# Patient Record
Sex: Female | Born: 1955 | Race: White | Hispanic: No | Marital: Married | Smoking: Former smoker
Health system: Southern US, Community
[De-identification: ages and names within clinical notes are randomized; demographics above are authoritative.]

## PROBLEM LIST (undated history)

## (undated) DIAGNOSIS — F32A Depression, unspecified: Secondary | ICD-10-CM

## (undated) DIAGNOSIS — R519 Headache, unspecified: Secondary | ICD-10-CM

## (undated) DIAGNOSIS — I10 Essential (primary) hypertension: Secondary | ICD-10-CM

## (undated) DIAGNOSIS — I2510 Atherosclerotic heart disease of native coronary artery without angina pectoris: Secondary | ICD-10-CM

## (undated) DIAGNOSIS — D3A090 Benign carcinoid tumor of the bronchus and lung: Secondary | ICD-10-CM

## (undated) DIAGNOSIS — I739 Peripheral vascular disease, unspecified: Secondary | ICD-10-CM

## (undated) DIAGNOSIS — E039 Hypothyroidism, unspecified: Secondary | ICD-10-CM

## (undated) DIAGNOSIS — R42 Dizziness and giddiness: Secondary | ICD-10-CM

## (undated) DIAGNOSIS — T8859XA Other complications of anesthesia, initial encounter: Secondary | ICD-10-CM

## (undated) DIAGNOSIS — F419 Anxiety disorder, unspecified: Secondary | ICD-10-CM

## (undated) DIAGNOSIS — T4145XA Adverse effect of unspecified anesthetic, initial encounter: Secondary | ICD-10-CM

## (undated) DIAGNOSIS — I714 Abdominal aortic aneurysm, without rupture, unspecified: Secondary | ICD-10-CM

## (undated) DIAGNOSIS — K219 Gastro-esophageal reflux disease without esophagitis: Secondary | ICD-10-CM

## (undated) HISTORY — DX: Gastro-esophageal reflux disease without esophagitis: K21.9

## (undated) HISTORY — PX: ABDOMINAL HYSTERECTOMY: SHX81

## (undated) HISTORY — DX: Dizziness and giddiness: R42

## (undated) HISTORY — PX: VESICOVAGINAL FISTULA CLOSURE W/ TAH: SUR271

## (undated) HISTORY — PX: BARTHOLIN CYST MARSUPIALIZATION: SHX5383

## (undated) HISTORY — DX: Hypothyroidism, unspecified: E03.9

## (undated) HISTORY — DX: Anxiety disorder, unspecified: F41.9

---

## 2001-03-13 ENCOUNTER — Ambulatory Visit (HOSPITAL_COMMUNITY): Admission: RE | Admit: 2001-03-13 | Discharge: 2001-03-13 | Payer: Self-pay | Admitting: Family Medicine

## 2001-03-13 ENCOUNTER — Encounter: Payer: Self-pay | Admitting: Family Medicine

## 2001-03-22 ENCOUNTER — Ambulatory Visit (HOSPITAL_COMMUNITY): Admission: RE | Admit: 2001-03-22 | Discharge: 2001-03-22 | Payer: Self-pay | Admitting: Family Medicine

## 2001-03-22 ENCOUNTER — Encounter: Payer: Self-pay | Admitting: Family Medicine

## 2001-04-03 ENCOUNTER — Ambulatory Visit (HOSPITAL_COMMUNITY): Admission: RE | Admit: 2001-04-03 | Discharge: 2001-04-03 | Payer: Self-pay | Admitting: Neurosurgery

## 2001-04-10 ENCOUNTER — Encounter (HOSPITAL_COMMUNITY): Admission: RE | Admit: 2001-04-10 | Discharge: 2001-05-10 | Payer: Self-pay | Admitting: Family Medicine

## 2001-05-13 ENCOUNTER — Encounter (HOSPITAL_COMMUNITY): Admission: RE | Admit: 2001-05-13 | Discharge: 2001-06-12 | Payer: Self-pay | Admitting: Family Medicine

## 2001-06-14 ENCOUNTER — Encounter (HOSPITAL_COMMUNITY): Admission: RE | Admit: 2001-06-14 | Discharge: 2001-07-14 | Payer: Self-pay | Admitting: Family Medicine

## 2002-02-11 ENCOUNTER — Encounter: Payer: Self-pay | Admitting: Neurosurgery

## 2002-02-11 ENCOUNTER — Ambulatory Visit (HOSPITAL_COMMUNITY): Admission: RE | Admit: 2002-02-11 | Discharge: 2002-02-11 | Payer: Self-pay | Admitting: Neurosurgery

## 2002-12-29 ENCOUNTER — Encounter: Payer: Self-pay | Admitting: *Deleted

## 2002-12-29 ENCOUNTER — Emergency Department (HOSPITAL_COMMUNITY): Admission: EM | Admit: 2002-12-29 | Discharge: 2002-12-29 | Payer: Self-pay | Admitting: *Deleted

## 2005-07-17 ENCOUNTER — Ambulatory Visit (HOSPITAL_COMMUNITY): Admission: RE | Admit: 2005-07-17 | Discharge: 2005-07-17 | Payer: Self-pay | Admitting: Specialist

## 2006-07-12 ENCOUNTER — Inpatient Hospital Stay (HOSPITAL_COMMUNITY): Admission: RE | Admit: 2006-07-12 | Discharge: 2006-07-14 | Payer: Self-pay | Admitting: Obstetrics and Gynecology

## 2006-07-12 ENCOUNTER — Encounter (INDEPENDENT_AMBULATORY_CARE_PROVIDER_SITE_OTHER): Payer: Self-pay | Admitting: Specialist

## 2008-04-06 ENCOUNTER — Ambulatory Visit (HOSPITAL_COMMUNITY): Admission: RE | Admit: 2008-04-06 | Discharge: 2008-04-06 | Payer: Self-pay | Admitting: Family Medicine

## 2010-08-31 ENCOUNTER — Ambulatory Visit (HOSPITAL_COMMUNITY): Admission: RE | Admit: 2010-08-31 | Discharge: 2010-08-31 | Payer: Self-pay | Admitting: Family Medicine

## 2010-09-07 ENCOUNTER — Encounter (HOSPITAL_COMMUNITY)
Admission: RE | Admit: 2010-09-07 | Discharge: 2010-10-07 | Payer: Self-pay | Source: Home / Self Care | Attending: Family Medicine | Admitting: Family Medicine

## 2010-09-14 ENCOUNTER — Encounter (INDEPENDENT_AMBULATORY_CARE_PROVIDER_SITE_OTHER): Payer: Self-pay | Admitting: *Deleted

## 2010-09-14 ENCOUNTER — Ambulatory Visit: Payer: Self-pay | Admitting: Gastroenterology

## 2010-10-04 ENCOUNTER — Ambulatory Visit
Admission: RE | Admit: 2010-10-04 | Discharge: 2010-10-04 | Payer: Self-pay | Source: Home / Self Care | Attending: Gastroenterology | Admitting: Gastroenterology

## 2010-10-04 DIAGNOSIS — R109 Unspecified abdominal pain: Secondary | ICD-10-CM | POA: Insufficient documentation

## 2010-10-05 ENCOUNTER — Encounter: Payer: Self-pay | Admitting: Gastroenterology

## 2010-10-07 LAB — CONVERTED CEMR LAB
Albumin: 4.6 g/dL (ref 3.5–5.2)
Total Bilirubin: 0.5 mg/dL (ref 0.3–1.2)

## 2010-10-23 ENCOUNTER — Encounter: Payer: Self-pay | Admitting: Family Medicine

## 2010-11-03 NOTE — Letter (Signed)
Summary: Unable to Reach, Consult Scheduled  [facility]  [address]   [place], [place] [address]   Phone: [phone]  Fax: [phone]    09/14/2010  Linda Mayer [address] [place], [place]  [address] September 18, 1956   Dear [name],   At the recommendation of DR [facility]  we have been asked to schedule you a consult with DR FIELDS for ABDOMINAL PAIN. Please call our office at [phone].     Thank you,    Diana Eves  [facility] R. Roetta Sessions, M.D.    Jonette Eva, M.D. Lorenza Burton, FNP-BC    Tana Coast, PA-C Phone: [phone]    Fax: [phone]

## 2010-11-03 NOTE — Assessment & Plan Note (Addendum)
Summary: abd pain/ss   Visit Type:  Initial Consult Referring Provider:  Dr. Regino Schultze Primary Care Provider:  Dr. Regino Schultze  CC:  upper R abd pain that moves to pts back.  History of Present Illness: [name] presents today as a consult at the request of Dr. Regino Schultze secondary to RUQ pain. She reports RUQ pain, at times radiating around to back X 4-5 mos. Intermittent in nature, at most 10/10, now reported as mainly "nagging". +belching relieves discomfort. Described as a "soreness". Only one episode of nausea, otherwise no nausea. No dysphagia or odynophagia. +worsening indigestion over past few months. Hx of reflux for several years. No loss of appetite, +weight gain, no wt loss. Gas-x, no tums. BM every morning. no brbpr, no melena. Reportedly takes aleve 3-4X/week. Last colonoscopy in 2003 by Dr. Lovell Sheehan: moderate internal/external hemorrhoids, few small diverticula. Repeat in 10 years.   [place] of abdomen Nov 2011: no stones, probable mild fatty liver HIDA Dec 2011: nl EF at 85%, no reproduction of symptoms No labs  Current Medications (verified): 1)  Alprazolam 0.5 Mg Tabs (Alprazolam) .... 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 1ppd X 30+years Occasional ETOH, socially Illicit Drug Use - no Smoking Status:  current Drug Use:  no  Review of Systems General:  Denies fever, chills, and anorexia. Eyes:  Denies blurring, irritation, and discharge. ENT:  Denies sore throat, hoarseness, and difficulty swallowing. CV:  Denies chest pains and syncope. Resp:  Denies dyspnea at rest and wheezing. GI:  Complains of  indigestion/heartburn and abdominal pain; denies difficulty swallowing, pain on swallowing, nausea, constipation, change in bowel habits, bloody BM's, and black BMs. GU:  Denies urinary burning, urinary frequency, and urinary incontinence. MS:  Denies joint pain / LOM, joint swelling, and joint stiffness. Derm:  Denies rash, itching, and dry skin. Neuro:  Denies weakness and syncope. Psych:  Denies depression and anxiety. Endo:  Denies cold intolerance and heat intolerance. Heme:  Denies bruising and bleeding.  Vital Signs:  Patient profile:   55 year old female Height:      65 inches Weight:      167 pounds BMI:     27.89 Temp:     97.8 degrees F oral Pulse rate:   76 / minute BP sitting:   130 / 88  (left arm) Cuff size:   regular  Vitals Entered By: Hendricks Limes LPN (October 04, 2010 11:04 AM)  Physical Exam  General:  Well developed, well nourished, no acute distress. Head:  Normocephalic and atraumatic. Eyes:  sclera without icterus Mouth:  No deformity or lesions, dentition normal. Lungs:  Clear throughout to auscultation. Heart:  Regular rate and rhythm; no murmurs, rubs,  or bruits. Abdomen:  normal bowel sounds, without guarding, without rebound, no tenderness, no masses, and no hepatomegally or splenomegaly.   Msk:  Symmetrical with no gross deformities. Normal posture. Pulses:  Normal pulses noted. Extremities:  No clubbing, cyanosis, edema or deformities noted. Neurologic:  Alert and  oriented x4;  grossly normal neurologically. Skin:  Intact without significant lesions or rashes. Psych:  Alert and cooperative. Normal mood and affect.  Impression & Recommendations:  Problem # 1:  ABDOMINAL  PAIN (ICD-57.55)  55 year old female with 4-5 month hx of RUQ pain, at times radiating around to back, intermittent, now just nagging and relieved by belching. hx of reflux, not on a PPI currently. No dysphagia or odynophagia. Does have reported weight gain, +smoking. +Aleve  use 3-4X week. [place] with no stones, probable fatty liver, HIDA nl. Likely gastritis vs PUD, secondary to NSAID use. Doubt biliary component.   Avoid NSAIDs Smoking cessation Being Prilosec 20 mg daily, called into pharmacy, progress report in 2 weeks. May need EGD if symptoms continue Baseline LFTs secondary to US findings of fatty liver, no recent labs. Return in 3 mos.  Orders: T-Hepatic Function [phone]) Consultation Level III [phone])  Appended Document: abd pain/ss    Prescriptions: PRILOSEC 20 MG CPDR (OMEPRAZOLE) take 1 by mouth 30 minutes before meals  #30 x 3   Entered and Authorized by:   Gerrit Halls NP   Signed by:   Gerrit Halls NP on 10/04/2010   Method used:   Faxed to ...       [facility]  [address] [address] [id number] (retail)       [address]       [place], [place]  [address]       Ph: [phone] or [phone]       Fax: [phone]   RxID:   [id number]     Appended Document: abd pain/ss 3 MONTH F/U OPV IS IN THE COMPUTER  Appended Document: abd pain/ss we need a PR on pt. She was prescribed a PPI at OV. If she is continuing to have reflux, and/or pain, she needs to be set up for an upper endoscopy.   Appended Document: abd pain/ss Pt said she is taking the Prilosec and it is helping, will call if she has problems.  Appended Document: abd pain/ss noted. thanks

## 2011-02-17 NOTE — Op Note (Signed)
NAME:  Linda Mayer, Linda Mayer NO.:  [id number]   MEDICAL RECORD NO.:  [id number]          PATIENT TYPE:  INP   LOCATION:  A417                          FACILITY:  [facility]   PHYSICIAN:  Tilda Burrow, M.D. DATE OF BIRTH:  01-18-1956   DATE OF PROCEDURE:  07/12/2006  DATE OF DISCHARGE:  07/14/2006                                 OPERATIVE REPORT   PREOPERATIVE DIAGNOSES:  1. Symptomatic uterine fibroids.  2. Pelvic pain.  3. Irregular periods.   POSTOPERATIVE DIAGNOSIS:  1. Symptomatic uterine fibroids.  2. Pelvic pain.  3. Irregular periods.   PROCEDURE:  Total abdominal hysterectomy.   SURGEON:  Tilda Burrow, M.D.   ASSESSMENT:  Annabell Howells, RN, Amie Critchley, CST.   ANESTHESIA:  General.   COMPLICATIONS:  None.   FINDINGS:  A large, bulky, irregular fibroid uterus, with lots of pressure  on the prominent sacrum suspected due to the fibroid's position at the  posterior fundal portion of the uterus, anteriorly-oriented vaginal axis.   DETAILS OF PROCEDURE:  The patient was taken to the operating room and  prepped and draped for lower abdominal surgery.  A Pfannenstiel incision  performed with the excision of a small layer of skin to improve pelvic  access.  The Pfannenstiel incision was performed through the fascia,  splitting the rectus muscles in the midline and opening the peritoneal  cavity bluntly without difficulty or suspicion of trauma to internal organs.  The bowel was well-emptied by the prep performed the night before, so  packing the bowel away was easily performed with two moistened laps, Balfour  retractor positioned and attention directed to the pelvis.  The round  ligaments were identified, grasped with Kelly clamps and transected after  being suture ligated.  The utero-ovarian ligaments were isolated, clamped,  cut, and suture ligated with 0 chromic.  The bladder flap was mobilized  anteriorly.  The upper and lower cardinal ligaments were serially  clamped,  cut, and suture ligated using straight Heaney clamps, knife dissection, and  0 chromic suture ligature.  We marched down to the level of the large bulky  cervix, making note that the vaginal support was very anterior in an  otherwise deep pelvis.  We therefore made a small figure-of-eight suture in  the posterior aspects of the cuff once we had reached that part of the case,  to be described later.   After marching down to the level of the cuff, we amputated the cervix off of  the lower uterine segment to improve access to the pelvis and then made a  stab incision in the anterior cervicovaginal fornix, amputated the large  bulbous cervix from the vaginal cuff, which was grasped with Kocher clamps  to maintain orientation.  A posterior figure-of-eight suture was placed to  develop a small posterior pouch to effectively lengthen the functional  vaginal length for the future.  The cuff was then closed with an Aldridge  stitch at each side of the lateral vaginal angle, followed by a continuous  running 0 chromic suture closure of the vaginal cuff.  Hemostasis was good.  The pelvis was irrigated and  hemostasis confirmed.  The peritoneum was  pulled over the cuff using interrupted 2-0 chromic sutures.  The pedicles  were inspected again and confirmed as hemostatic.  Laparotomy equipment  removed, the abdomen irrigated again, anterior peritoneum closed with 2-0  chromic, the rectus muscles reapproximated with 0 Vicryl with some oozing  from the left rectus muscles necessitating one additional stitch in the  rectus muscle to improve hemostasis.  We then closed the fascia in two  separate layers in the lateral one-third of the incision and the middle  portion closed, with both layers closed with the same suture with continuous  running 0 Vicryl suture closure of the fascia.  The subcutaneous tissues  were mobilized and left to be pulled together at 1 cm beneath the skin,  resulting in  good obliteration of the potential space with staple closure of  the skin completing the procedure.  Sponge and needle counts were correct.  The patient tolerated the procedure well and went to the recovery room in  good condition.      Tilda Burrow, M.D.  Electronically Signed     JVF/MEDQ  D:  07/12/2006  T:  07/13/2006  Job:  [id number]   cc:   Kirk Ruths, M.D.  Fax: [phone]

## 2011-02-17 NOTE — Discharge Summary (Signed)
NAME:  MICHAELANN, GUNNOE NO.:  [id number]   MEDICAL RECORD NO.:  [id number]          PATIENT TYPE:  INP   LOCATION:  A417                          FACILITY:  [facility]   PHYSICIAN:  Tilda Burrow, M.D. DATE OF BIRTH:  1955/11/30   DATE OF ADMISSION:  07/12/2006  DATE OF DISCHARGE:  10/13/2007LH                                 DISCHARGE SUMMARY   ADMITTING DIAGNOSES:  1. Uterine fibroids.  2. History of endometrial hyperplasia.  3. Pelvic pain.   DISCHARGE DIAGNOSES:  1. Uterine fibroids, benign.  2. History of endometrial hyperplasia.  3. Pelvic pain.   PROCEDURES:  Total abdominal hysterectomy.   DISCHARGE MEDICATIONS:  1. Dulcolax suppository to be used at home x1.  2. Tylox 1 q.4 h. p.r.n. pain, dispense #20.   FOLLOW UP:  Followup in 6 days [facility].   HOSPITAL SUMMARY:  The patient was admitted for hysterectomy as described in  the HPI.  She went through day surgery, having received bowel prep the day  before surgery.  She had a hemoglobin preop of 13, hematocrit 37.9.  Hysterectomy removed a 219 g uterus with multiple irregular fibroids.  There  was some skin excised as well in order to optimize the incision appearance.  The patient had a postoperative hemoglobin of 12.4, hematocrit 35.8, white  count 9900.  She remained afebrile.  Tolerated a regular diet on the first  postoperative day.  Had some gas complaints, gassy discomfort, the second  postop day but nonetheless was comfortable with going home and giving  herself a Dulcolax suppository at home.   She will followup in 6 days for staple removal in our office.  Signs and  symptoms of fever, infection, or wound complications reviewed.  May shower  immediately and tub bath, and drive after 2 weeks.      Tilda Burrow, M.D.  Electronically Signed     JVF/MEDQ  D:  07/14/2006  T:  07/16/2006  Job:  [id number]   cc:   [facility]
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
## 2011-10-16 IMAGING — NM NM HEPATO W/GB/PHARM/[PERSON_NAME]
2 series · 12 of 12 positions shown · non-contrast
Comparison: None

CLINICAL DATA: Right upper quadrant pain

NUCLEAR MEDICINE HEPATOBILIARY IMAGING WITH GALLBLADDER EF:
TECHNIQUE: Sequential images of the abdomen were obtained for 60
minutes following intravenous administration of
radiopharmaceutical.  Patient then received an infusion of
ugm/kg of CCK analog intravenously over 30 minutes, and imaging was
continued for 30 minutes.  A time-activity curve was generated from
tracer within the gallbladder following CCK administration, and the
gallbladder ejection fraction was calculated.
Radiopharmaceutical:  5.2 mCi 3c-44m mebrofenin
Pharmaceutical:  1.45 mcg CCK

[hida · 3.20mm/px · 6 of 60 frames shown (1 of 2)]
[frame 6/60]
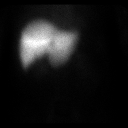
[frame 16/60]
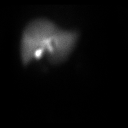
[frame 26/60]
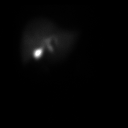
[frame 36/60]
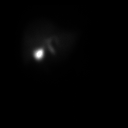
[frame 46/60]
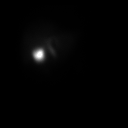
[frame 56/60]
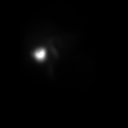

[hida · 3.20mm/px · 6 of 30 frames shown (2 of 2)]
[frame 3/30]
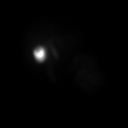
[frame 8/30]
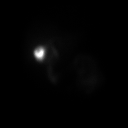
[frame 13/30]
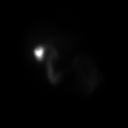
[frame 18/30]
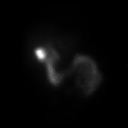
[frame 23/30]
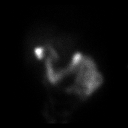
[frame 28/30]
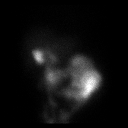

[12 of 12 positions shown; findings below may reference images not displayed]

FINDINGS: Prompt tracer extraction from bloodstream, indicating normal
hepatocellular function.
Prompt excretion of tracer into biliary tree.
Gallbladder visualized at 14 minutes.
Small bowel visualized at 54 minutes.
No hepatic retention of tracer.

Subjectively normal emptying of tracer from gallbladder following
CCK stimulation.
Calculated gallbladder ejection fraction is 85%, normal.
Patient  did not experience symptoms following CCK administration.
IMPRESSION: Normal exam.

Normal values for gallbladder ejection fraction:
> 30% for exams utilizing sincalide (CCK)
> 50% for exams utilizing fatty meal stimulation

## 2011-11-13 ENCOUNTER — Other Ambulatory Visit (HOSPITAL_COMMUNITY): Payer: Self-pay | Admitting: Family Medicine

## 2011-11-13 DIAGNOSIS — Z139 Encounter for screening, unspecified: Secondary | ICD-10-CM

## 2011-11-14 ENCOUNTER — Ambulatory Visit (HOSPITAL_COMMUNITY)
Admission: RE | Admit: 2011-11-14 | Discharge: 2011-11-14 | Disposition: A | Discharge status: Home / Self Care | Payer: 59 | Source: Ambulatory Visit | Attending: Family Medicine | Admitting: Family Medicine

## 2011-11-14 DIAGNOSIS — Z1231 Encounter for screening mammogram for malignant neoplasm of breast: Secondary | ICD-10-CM | POA: Insufficient documentation

## 2011-11-14 DIAGNOSIS — Z139 Encounter for screening, unspecified: Secondary | ICD-10-CM

## 2013-12-12 ENCOUNTER — Other Ambulatory Visit: Payer: Self-pay | Admitting: Obstetrics and Gynecology

## 2013-12-12 DIAGNOSIS — Z139 Encounter for screening, unspecified: Secondary | ICD-10-CM

## 2014-01-05 ENCOUNTER — Ambulatory Visit (HOSPITAL_COMMUNITY)
Admission: RE | Admit: 2014-01-05 | Discharge: 2014-01-05 | Disposition: A | Discharge status: Home / Self Care | Payer: 59 | Source: Ambulatory Visit | Attending: Obstetrics and Gynecology | Admitting: Obstetrics and Gynecology

## 2014-01-05 DIAGNOSIS — Z1231 Encounter for screening mammogram for malignant neoplasm of breast: Secondary | ICD-10-CM | POA: Insufficient documentation

## 2014-01-05 DIAGNOSIS — Z139 Encounter for screening, unspecified: Secondary | ICD-10-CM

## 2014-01-07 ENCOUNTER — Other Ambulatory Visit: Payer: Self-pay | Admitting: Obstetrics and Gynecology

## 2014-01-07 DIAGNOSIS — R928 Other abnormal and inconclusive findings on diagnostic imaging of breast: Secondary | ICD-10-CM

## 2014-01-28 ENCOUNTER — Encounter (HOSPITAL_COMMUNITY): Payer: 59

## 2014-02-18 ENCOUNTER — Ambulatory Visit (HOSPITAL_COMMUNITY)
Admission: RE | Admit: 2014-02-18 | Discharge: 2014-02-18 | Disposition: A | Discharge status: Home / Self Care | Payer: 59 | Source: Ambulatory Visit | Attending: Obstetrics and Gynecology | Admitting: Obstetrics and Gynecology

## 2014-02-18 ENCOUNTER — Other Ambulatory Visit: Payer: Self-pay | Admitting: Obstetrics and Gynecology

## 2014-02-18 DIAGNOSIS — R928 Other abnormal and inconclusive findings on diagnostic imaging of breast: Secondary | ICD-10-CM

## 2014-10-28 ENCOUNTER — Other Ambulatory Visit (HOSPITAL_COMMUNITY): Payer: Self-pay | Admitting: Family Medicine

## 2014-10-28 DIAGNOSIS — E0590 Thyrotoxicosis, unspecified without thyrotoxic crisis or storm: Secondary | ICD-10-CM

## 2014-10-29 ENCOUNTER — Ambulatory Visit (HOSPITAL_COMMUNITY)
Admission: RE | Admit: 2014-10-29 | Discharge: 2014-10-29 | Disposition: A | Discharge status: Home / Self Care | Payer: 59 | Source: Ambulatory Visit | Attending: Family Medicine | Admitting: Family Medicine

## 2014-10-29 DIAGNOSIS — E0590 Thyrotoxicosis, unspecified without thyrotoxic crisis or storm: Secondary | ICD-10-CM | POA: Diagnosis not present

## 2014-11-16 ENCOUNTER — Other Ambulatory Visit (HOSPITAL_COMMUNITY): Payer: Self-pay | Admitting: "Endocrinology

## 2014-11-16 DIAGNOSIS — E049 Nontoxic goiter, unspecified: Secondary | ICD-10-CM

## 2015-05-17 ENCOUNTER — Ambulatory Visit (HOSPITAL_COMMUNITY): Admission: RE | Admit: 2015-05-17 | Payer: 59 | Source: Ambulatory Visit

## 2015-11-08 ENCOUNTER — Ambulatory Visit: Payer: Self-pay | Admitting: "Endocrinology

## 2015-12-29 ENCOUNTER — Ambulatory Visit (INDEPENDENT_AMBULATORY_CARE_PROVIDER_SITE_OTHER): Payer: BLUE CROSS/BLUE SHIELD | Admitting: "Endocrinology

## 2015-12-29 ENCOUNTER — Encounter: Payer: Self-pay | Admitting: "Endocrinology

## 2015-12-29 VITALS — BP 126/74 | HR 80 | Ht 64.0 in | Wt 160.0 lb

## 2015-12-29 DIAGNOSIS — E0590 Thyrotoxicosis, unspecified without thyrotoxic crisis or storm: Secondary | ICD-10-CM | POA: Insufficient documentation

## 2015-12-29 DIAGNOSIS — E042 Nontoxic multinodular goiter: Secondary | ICD-10-CM

## 2015-12-29 NOTE — Progress Notes (Signed)
Subjective:    Patient ID: Linda Mayer, female    DOB: 10/31/1955, PCP Linda PostKOBERLEIN, JUNELL CAROL, MD   Past Medical History  Diagnosis Date  . GERD (gastroesophageal reflux disease)   . Anxiety    Past Surgical History  Procedure Laterality Date  . Vesicovaginal fistula closure w/ tah    . Bartholin cyst marsupialization      Removal   Social History   Social History  . Marital Status: Married    Spouse Name: N/A  . Number of Children: N/A  . Years of Education: N/A   Social History Main Topics  . Smoking status: Current Every Day Smoker  . Smokeless tobacco: None  . Alcohol Use: No  . Drug Use: No  . Sexual Activity: Not Asked   Other Topics Concern  . None   Social History Narrative   Outpatient Encounter Prescriptions as of 12/29/2015  Medication Sig  . ALPRAZolam (XANAX) 0.5 MG tablet Take 0.5 mg by mouth at bedtime as needed.    Marland Kitchen. lisinopril (PRINIVIL,ZESTRIL) 10 MG tablet Take 10 mg by mouth daily.  Marland Kitchen. omeprazole (PRILOSEC) 20 MG capsule Take 20 mg by mouth daily.    . pravastatin (PRAVACHOL) 20 MG tablet Take 20 mg by mouth daily.   No facility-administered encounter medications on file as of 12/29/2015.   ALLERGIES: No Known Allergies VACCINATION STATUS:  There is no immunization history on file for this patient.  HPI [name] is a 60 yr old female with medical hx as above. she is Here to follow-up for her history of multinodular goiter associated with subclinical hyperthyroidism. She was seen last year with ultrasound and thyroid function test. She couldn't come back because in the interim she lost her insurance. She denies any new complaints. she denies family hx of thyroid cancer. she was found to have clinical goiter after which a thyroid ultrasound on 10/29/2014 confirmed MNG. rt lobe 7.7 cms x 2.8cms x 3.2 cms with 2 nodules largest one 1.3 cms. left lobe 5.8 cms x 2.4 cms x 2.5 cms with 2 nodules largest 1.3 cms. -No subsequent  follow-up ultrasound was performed. Her associated TFTs is remarkable only for slightly suppressed TSH of 0.36, repeat shows 0.24. she denies dysphagia, SOB, nor voice change. she denies cold/heat intolerance. she denies exposure to neck radiation.  Review of Systems Constitutional: no weight gain/loss, no fatigue, no subjective hyperthermia/hypothermia Eyes: no blurry vision, no xerophthalmia ENT: no sore throat, no nodules palpated in throat, no dysphagia/odynophagia, no hoarseness Cardiovascular: no CP/SOB/palpitations/leg swelling Respiratory: no cough/SOB Gastrointestinal: no N/V/D/C Musculoskeletal: no muscle/joint aches Skin: no rashes Neurological: no tremors/numbness/tingling/dizziness Psychiatric: no depression/anxiety  Objective:    BP 126/74 mmHg  Pulse 80  Ht 5\' 4"  (1.626 m)  Wt 160 lb (72.576 kg)  BMI 27.45 kg/m2  SpO2 95%  Wt Readings from Last 3 Encounters:  12/29/15 160 lb (72.576 kg)  10/04/10 167 lb (75.751 kg)    Physical Exam Constitutional: overweight, in NAD Eyes: PERRLA, EOMI, no exophthalmos ENT: moist mucous membranes, + asymmetric thyromegaly right lobe larger than left, no cervical lymphadenopathy Cardiovascular: RRR, No MRG Respiratory: CTA B Gastrointestinal: abdomen soft, NT, ND, BS+ Musculoskeletal: no deformities, strength intact in all 4 Skin: moist, warm, no rashes Neurological: no tremor with outstretched hands, DTR normal in all 4   CMP     Component Value Date/Time   PROT 7.5 10/05/2010 2007   ALBUMIN 4.6 10/05/2010 2007   AST 22 10/05/2010 2007  ALT 37* 10/05/2010 2007   ALKPHOS 96 10/05/2010 2007   BILITOT 0.5 10/05/2010 2007    Assessment & Plan:   1. Multinodular goiter 2. Subclinical hyperthyroidism  Her studies from last year are reviewed and discussed with her again. she has sub clinical hyperthyroidism associated with MNG. she will  need repeat thyroid ultrasound and thyroid function test. Further options will  depend on the results of these tests. -If the nodules grow significantly she may be considered for fine needle aspiration. -She is counseled about smoking cessation.  - I advised patient to maintain close follow up with Linda Post, MD for primary care needs. Follow up plan: Return in about 2 weeks (around 01/12/2016) for labs today, Thyroid Ultrasound.  Marquis Lunch, MD Phone: [phone]  Fax: [phone]   12/29/2015, 8:43 PM

## 2016-01-09 LAB — T4, FREE: Free T4: 1.3 ng/dL (ref 0.8–1.8)

## 2016-01-09 LAB — TSH: TSH: 0.26 m[IU]/L — AB

## 2016-01-12 ENCOUNTER — Ambulatory Visit (HOSPITAL_COMMUNITY)
Admission: RE | Admit: 2016-01-12 | Discharge: 2016-01-12 | Disposition: A | Discharge status: Home / Self Care | Payer: BLUE CROSS/BLUE SHIELD | Source: Ambulatory Visit | Attending: "Endocrinology | Admitting: "Endocrinology

## 2016-01-12 ENCOUNTER — Other Ambulatory Visit (HOSPITAL_COMMUNITY): Payer: Self-pay | Admitting: Internal Medicine

## 2016-01-12 ENCOUNTER — Ambulatory Visit (HOSPITAL_COMMUNITY): Payer: 59

## 2016-01-12 ENCOUNTER — Ambulatory Visit: Payer: BLUE CROSS/BLUE SHIELD | Admitting: "Endocrinology

## 2016-01-12 DIAGNOSIS — E042 Nontoxic multinodular goiter: Secondary | ICD-10-CM | POA: Insufficient documentation

## 2016-01-12 DIAGNOSIS — Z1231 Encounter for screening mammogram for malignant neoplasm of breast: Secondary | ICD-10-CM

## 2016-01-18 ENCOUNTER — Ambulatory Visit: Payer: BLUE CROSS/BLUE SHIELD | Admitting: "Endocrinology

## 2016-01-20 ENCOUNTER — Encounter: Payer: Self-pay | Admitting: "Endocrinology

## 2016-01-20 ENCOUNTER — Ambulatory Visit (INDEPENDENT_AMBULATORY_CARE_PROVIDER_SITE_OTHER): Payer: BLUE CROSS/BLUE SHIELD | Admitting: "Endocrinology

## 2016-01-20 VITALS — BP 144/79 | HR 80 | Ht 64.0 in | Wt 163.0 lb

## 2016-01-20 DIAGNOSIS — E042 Nontoxic multinodular goiter: Secondary | ICD-10-CM | POA: Diagnosis not present

## 2016-01-20 DIAGNOSIS — E0590 Thyrotoxicosis, unspecified without thyrotoxic crisis or storm: Secondary | ICD-10-CM

## 2016-01-20 NOTE — Progress Notes (Signed)
Subjective:    Patient ID: Linda Mayer, female    DOB: 14-Nov-1955, PCP [facility]   Past Medical History  Diagnosis Date  . GERD (gastroesophageal reflux disease)   . Anxiety    Past Surgical History  Procedure Laterality Date  . Vesicovaginal fistula closure w/ tah    . Bartholin cyst marsupialization      Removal   Social History   Social History  . Marital Status: Married    Spouse Name: N/A  . Number of Children: N/A  . Years of Education: N/A   Social History Main Topics  . Smoking status: Current Every Day Smoker  . Smokeless tobacco: None  . Alcohol Use: No  . Drug Use: No  . Sexual Activity: Not Asked   Other Topics Concern  . None   Social History Narrative   Outpatient Encounter Prescriptions as of 01/20/2016  Medication Sig  . ALPRAZolam (XANAX) 0.5 MG tablet Take 0.5 mg by mouth at bedtime as needed.    Marland Kitchen. lisinopril (PRINIVIL,ZESTRIL) 10 MG tablet Take 10 mg by mouth daily.  Marland Kitchen. omeprazole (PRILOSEC) 20 MG capsule Take 20 mg by mouth daily.    . pravastatin (PRAVACHOL) 20 MG tablet Take 20 mg by mouth daily.   No facility-administered encounter medications on file as of 01/20/2016.   ALLERGIES: No Known Allergies VACCINATION STATUS:  There is no immunization history on file for this patient.  HPI Linda Mayer is a 60 yr old female with medical hx as above. she is Here to follow-up for her history of multinodular goiter associated with subclinical hyperthyroidism. -Her repeat thyroid ultrasound confirms enlarging bilateral thyroid nodules. Her associated TFTs is remarkable only for slightly suppressed TSH , still consistent with subclinical hyperthyroidism. she denies dysphagia, SOB, nor voice change. she denies cold/heat intolerance. she denies exposure to neck radiation.  Review of Systems Constitutional: no weight gain/loss, no fatigue, no subjective hyperthermia/hypothermia Eyes: no blurry vision, no  xerophthalmia ENT: no sore throat, no nodules palpated in throat, no dysphagia/odynophagia, no hoarseness Cardiovascular: no CP/SOB/palpitations/leg swelling Respiratory: no cough/SOB Gastrointestinal: no N/V/D/C Musculoskeletal: no muscle/joint aches Skin: no rashes Neurological: no tremors/numbness/tingling/dizziness Psychiatric: no depression/anxiety  Objective:    BP 144/79 mmHg  Pulse 80  Ht 5\' 4"  (1.626 m)  Wt 163 lb (73.936 kg)  BMI 27.97 kg/m2  SpO2 95%  Wt Readings from Last 3 Encounters:  01/20/16 163 lb (73.936 kg)  12/29/15 160 lb (72.576 kg)  10/04/10 167 lb (75.751 kg)    Physical Exam Constitutional: overweight, in NAD Eyes: PERRLA, EOMI, no exophthalmos ENT: moist mucous membranes, + asymmetric thyromegaly right lobe larger than left, no cervical lymphadenopathy Cardiovascular: RRR, No MRG Respiratory: CTA B Gastrointestinal: abdomen soft, NT, ND, BS+ Musculoskeletal: no deformities, strength intact in all 4 Skin: moist, warm, no rashes Neurological: no tremor with outstretched hands, DTR normal in all 4   Recent Results (from the past 2160 hour(s))  T4, free     Status: None   Collection Time: 12/29/15  8:12 AM  Result Value Ref Range   Free T4 1.3 0.8 - 1.8 ng/dL  TSH     Status: Abnormal   Collection Time: 12/29/15  8:12 AM  Result Value Ref Range   TSH 0.26 (L) mIU/L    Comment:   Reference Range   > or = 20 Years  0.40-4.50   Pregnancy Range First trimester  0.26-2.66 Second trimester 0.55-2.73 Third trimester  0.43-2.91      -  Enlarging 1.6 cm nodule on the right lobe and enlarging 2 cm nodule on the left lobe-on thyroid ultrasound from 01/12/2016  Assessment & Plan:   1. Multinodular goiter 2. Subclinical hyperthyroidism -I have discussed the findings of the thyroid function test as well as the repeat thyroid ultrasound.  she will  need  fine needle aspiration of largest nodules on bilateral thyroid lobe. -She will return in 2  weeks with biopsy results. Further steps would depend on findings. -She is counseled about smoking cessation.  - I advised patient to maintain close follow up with [facility] for primary care needs. Follow up plan: Return in about 2 weeks (around 02/03/2016) for biopsy results.  Marquis Lunch, MD Phone: [phone]  Fax: [phone]   01/20/2016, 4:06 PM

## 2016-01-26 ENCOUNTER — Other Ambulatory Visit: Payer: Self-pay | Admitting: "Endocrinology

## 2016-01-26 ENCOUNTER — Ambulatory Visit (HOSPITAL_COMMUNITY): Admission: RE | Admit: 2016-01-26 | Payer: BLUE CROSS/BLUE SHIELD | Source: Ambulatory Visit

## 2016-01-26 ENCOUNTER — Encounter (HOSPITAL_COMMUNITY): Payer: Self-pay

## 2016-01-26 ENCOUNTER — Ambulatory Visit (HOSPITAL_COMMUNITY)
Admission: RE | Admit: 2016-01-26 | Discharge: 2016-01-26 | Disposition: A | Discharge status: Home / Self Care | Payer: BLUE CROSS/BLUE SHIELD | Source: Ambulatory Visit | Attending: "Endocrinology | Admitting: "Endocrinology

## 2016-01-26 DIAGNOSIS — E042 Nontoxic multinodular goiter: Secondary | ICD-10-CM

## 2016-01-26 MED ORDER — LIDOCAINE HCL (PF) 2 % IJ SOLN
INTRAMUSCULAR | Status: AC
  Filled 2016-01-26: qty 20

## 2016-01-26 NOTE — Discharge Instructions (Signed)

## 2016-02-08 ENCOUNTER — Encounter: Payer: Self-pay | Admitting: "Endocrinology

## 2016-02-08 ENCOUNTER — Ambulatory Visit (INDEPENDENT_AMBULATORY_CARE_PROVIDER_SITE_OTHER): Payer: BLUE CROSS/BLUE SHIELD | Admitting: "Endocrinology

## 2016-02-08 VITALS — BP 130/86 | HR 78 | Ht 64.0 in | Wt 162.0 lb

## 2016-02-08 DIAGNOSIS — E042 Nontoxic multinodular goiter: Secondary | ICD-10-CM

## 2016-02-08 NOTE — Progress Notes (Signed)
Subjective:    Patient ID: Linda Mayer, female    DOB: [birth year], PCP [facility]   Past Medical History  Diagnosis Date  . GERD (gastroesophageal reflux disease)   . Anxiety    Past Surgical History  Procedure Laterality Date  . Vesicovaginal fistula closure w/ tah    . Bartholin cyst marsupialization      Removal   Social History   Social History  . Marital Status: Married    Spouse Name: N/A  . Number of Children: N/A  . Years of Education: N/A   Social History Main Topics  . Smoking status: Current Every Day Smoker  . Smokeless tobacco: None  . Alcohol Use: No  . Drug Use: No  . Sexual Activity: Not Asked   Other Topics Concern  . None   Social History Narrative   Outpatient Encounter Prescriptions as of 02/08/2016  Medication Sig  . ALPRAZolam (XANAX) 0.5 MG tablet Take 0.5 mg by mouth at bedtime as needed.    Marland Kitchen lisinopril (PRINIVIL,ZESTRIL) 10 MG tablet Take 10 mg by mouth daily.  Marland Kitchen omeprazole (PRILOSEC) 20 MG capsule Take 20 mg by mouth daily.    . pravastatin (PRAVACHOL) 20 MG tablet Take 20 mg by mouth daily.   No facility-administered encounter medications on file as of 02/08/2016.   ALLERGIES: No Known Allergies VACCINATION STATUS:  There is no immunization history on file for this patient.  HPI Linda Mayer is a 60 yr old female with medical hx as above. she is Here to follow-up for her history of multinodular goiter associated with subclinical hyperthyroidism. -Her repeat thyroid ultrasound confirms enlarging bilateral thyroid nodules. -Fine-needle aspiration of her thyroid nodules reveals cellular atypia of undetermined significance. Her associated TFTs is remarkable only for slightly suppressed TSH , still consistent with subclinical hyperthyroidism. she denies dysphagia, SOB, nor voice change. she denies cold/heat intolerance. she denies exposure to neck radiation.  Review of Systems Constitutional: no weight  gain/loss, no fatigue, no subjective hyperthermia/hypothermia Eyes: no blurry vision, no xerophthalmia ENT: no sore throat, no nodules palpated in throat, no dysphagia/odynophagia, no hoarseness Cardiovascular: no CP/SOB/palpitations/leg swelling Respiratory: no cough/SOB Gastrointestinal: no N/V/D/C Musculoskeletal: no muscle/joint aches Skin: no rashes Neurological: no tremors/numbness/tingling/dizziness Psychiatric: no depression/anxiety  Objective:    BP 130/86 mmHg  Pulse 78  Ht  (1.626 m)  Wt 162 lb (73.483 kg)  BMI 27.79 kg/m2  Wt Readings from Last 3 Encounters:  02/08/16 162 lb (73.483 kg)  01/20/16 163 lb (73.936 kg)  12/29/15 160 lb (72.576 kg)    Physical Exam Constitutional: overweight, in NAD Eyes: PERRLA, EOMI, no exophthalmos ENT: moist mucous membranes, + asymmetric thyromegaly right lobe larger than left, no cervical lymphadenopathy Cardiovascular: RRR, No MRG Respiratory: CTA B Gastrointestinal: abdomen soft, NT, ND, BS+ Musculoskeletal: no deformities, strength intact in all 4 Skin: moist, warm, no rashes Neurological: no tremor with outstretched hands, DTR normal in all 4   Recent Results (from the past 2160 hour(s))  T4, free     Status: None   Collection Time: 12/29/15  8:12 AM  Result Value Ref Range   Free T4 1.3 0.8 - 1.8 ng/dL  TSH     Status: Abnormal   Collection Time: 12/29/15  8:12 AM  Result Value Ref Range   TSH 0.26 (L) mIU/L    Comment:   Reference Range   > or = 20 Years  0.40-4.50   Pregnancy Range First trimester  0.26-2.66 Second  trimester 0.55-2.73 Third trimester  0.43-2.91      -Enlarging 1.6 cm nodule on the right lobe and enlarging 2 cm nodule on the left lobe-on thyroid ultrasound from 01/12/2016  - FNA on 01/25/2006 showed cellular atypia of undetermined significance.  Assessment & Plan:   1. Multinodular goiter with fine-needle aspiration showing cellular atypia of undetermined significance 2.  Subclinical hyperthyroidism -I have discussed the findings of the fine needle aspiration of thyroid nodules, thyroid function test . Based on the finding of cellular atypia of undetermined significance : Bethesda category III  ( there is 5-15 % risk of malignancy), she  is advised for thyroidectomy. Given the fact that there are multiple nodules on bilateral thyroid lobes as well as at isthmus, she would benefit from near total thyroidectomy with subsequent replacement of thyroid hormones.  She voices understanding and agrees with this plan.  -I will refer her to Dr. Lovell SheehanJenkins for same. -She will return in  6 weeks with   thyroid function tests.   Further steps would depend on findings. -She is counseled about smoking cessation.  - I advised patient to [facility] for primary care needs. Follow up plan: Return in about 6 weeks (around 03/21/2016) for follow up with pre-visit labs.  Linda LunchGebre Delron Comer, MD Phone: [phone]  Fax: [phone]   02/08/2016, 2:38 PM

## 2016-02-08 NOTE — Patient Instructions (Signed)
Surgery recommended

## 2016-02-10 ENCOUNTER — Ambulatory Visit: Payer: BLUE CROSS/BLUE SHIELD | Admitting: "Endocrinology

## 2016-02-14 ENCOUNTER — Ambulatory Visit (HOSPITAL_COMMUNITY)
Admission: RE | Admit: 2016-02-14 | Discharge: 2016-02-14 | Disposition: A | Discharge status: Home / Self Care | Payer: BLUE CROSS/BLUE SHIELD | Source: Ambulatory Visit | Attending: Internal Medicine | Admitting: Internal Medicine

## 2016-02-14 DIAGNOSIS — R928 Other abnormal and inconclusive findings on diagnostic imaging of breast: Secondary | ICD-10-CM | POA: Insufficient documentation

## 2016-02-14 DIAGNOSIS — Z1231 Encounter for screening mammogram for malignant neoplasm of breast: Secondary | ICD-10-CM | POA: Diagnosis present

## 2016-02-17 ENCOUNTER — Other Ambulatory Visit: Payer: Self-pay | Admitting: Internal Medicine

## 2016-02-17 DIAGNOSIS — R928 Other abnormal and inconclusive findings on diagnostic imaging of breast: Secondary | ICD-10-CM

## 2016-02-21 ENCOUNTER — Other Ambulatory Visit (HOSPITAL_COMMUNITY): Payer: Self-pay | Admitting: Physician Assistant

## 2016-02-21 DIAGNOSIS — R928 Other abnormal and inconclusive findings on diagnostic imaging of breast: Secondary | ICD-10-CM

## 2016-02-22 ENCOUNTER — Ambulatory Visit (HOSPITAL_COMMUNITY)
Admission: RE | Admit: 2016-02-22 | Discharge: 2016-02-22 | Disposition: A | Discharge status: Home / Self Care | Payer: BLUE CROSS/BLUE SHIELD | Source: Ambulatory Visit | Attending: Physician Assistant | Admitting: Physician Assistant

## 2016-02-22 DIAGNOSIS — R928 Other abnormal and inconclusive findings on diagnostic imaging of breast: Secondary | ICD-10-CM | POA: Diagnosis present

## 2016-03-01 ENCOUNTER — Telehealth: Payer: Self-pay

## 2016-03-01 NOTE — Telephone Encounter (Signed)
Pt no showed for appointment that she was made aware of with Dr Lovell SheehanJenkins ( General Surgery). She was referred to Dr Lovell SheehanJenkins by Dr Fransico HimNida for a recommended thyroidectomy.

## 2016-03-22 ENCOUNTER — Telehealth: Payer: Self-pay

## 2016-03-22 ENCOUNTER — Ambulatory Visit: Payer: BLUE CROSS/BLUE SHIELD | Admitting: "Endocrinology

## 2016-03-22 NOTE — Telephone Encounter (Signed)
Resent referral for pt to see Dr Lovell SheehanJenkins. Pt noshowed for previous appt.

## 2016-04-14 NOTE — H&P (Signed)
  NTS SOAP Note  Vital Signs:  Vitals as of: 04/13/2016: Systolic 138: Diastolic 83: Heart Rate 83: Temp 97.62F (Temporal): Height 425ft 5in: Weight 160Lbs 0 Ounces: BMI 26.63   BMI : 26.63 kg/m2  Subjective: This 60 year old female presents for of multinodular goiter.  Has been present for some time.  FNA shows follicular cells.  Has multiple nodules bilaterally.  No dysphagia, voice changes.  No heart palpitations, weight loss, h/o neck irradiation, family h/o thyroid cancer (patient adopted), heat intolerance.  Review of Symptoms:  Constitutional:fatigue Head:negative Eyes:negative Nose/Mouth/Throat:negative Cardiovascular:negative Respiratory:negative Gastrointestinnegative Genitourinary:negative joint pain dry Hematolgic/Lymphatic:negative Allergic/Immunologic:negative   Past Medical History:Reviewed  Past Medical History  Surgical History: TAH Medical Problems: HTN Allergies: nkda Medications: lisinopril, alprazolam   Social History:Reviewed  Social History  Preferred Language: English Race:  White Ethnicity: Not Hispanic / Latino Age: [age] year Marital Status:  S Alcohol: rarely   Smoking Status: Current every day smoker reviewed on 04/13/2016 Started Date:  Packs per day: 1.00 Functional Status reviewed on 04/13/2016 ------------------------------------------------ Bathing: Normal Cooking: Normal Dressing: Normal Driving: Normal Eating: Normal Managing Meds: Normal Oral Care: Normal Shopping: Normal Toileting: Normal Transferring: Normal Walking: Normal Cognitive Status reviewed on 04/13/2016 ------------------------------------------------ Attention: Normal Decision Making: Normal Language: Normal Memory: Normal Motor: Normal Perception: Normal Problem Solving: Normal Visual and Spatial: Normal   Family History:Reviewed  Family Health History Family History is Unknown    Objective Information: General:Well  appearing, well nourished in no distress. Skin:no rash or prominent lesions Head:Atraumatic; no masses; no abnormalities Throat:no erythema, exudates or lesions. Enlarged nodular thyroid gland bilaterally.  No lymphadenopathy.  No carotid bruits. Heart:RRR, no murmur or gallop.  Normal S1, S2.  No S3, S4.  Lungs:CTA bilaterally, no wheezes, rhonchi, rales.  Breathing unlabored. Dr. Isidoro DonningNida's notes reviewed. 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   Patient Education:Alternative treatments to surgery were discussed with patient (and family).Risks and benefits  of procedure including bleeding, infection, nerve injury, voice changes, and the possibility of malignancy were fully explained to the patient (and family) who gave informed consent. Patient/family questions were addressed.  Follow-up:Pending Surgery

## 2016-04-25 NOTE — Progress Notes (Signed)
No note

## 2016-05-05 NOTE — Patient Instructions (Signed)
Your procedure is scheduled on: 05/12/2016  Report to Linda Mayer at  6:15   AM.  Call this number if you have problems the morning of surgery: [phone]   Remember:   Do not drink or eat food:After Midnight.  :  Take these medicines the morning of surgery with A SIP OF WATER: lisinopril and omeprazole   Do not wear jewelry, make-up or nail polish.  Do not wear lotions, powders, or perfumes. You may wear deodorant.  Do not shave 48 hours prior to surgery. Men may shave face and neck.  Do not bring valuables to the hospital.  Contacts, dentures or bridgework may not be worn into surgery.  Leave suitcase in the car. After surgery it may be brought to your room.  For patients admitted to the hospital, checkout time is 11:00 AM the day of discharge.   Patients discharged the day of surgery will not be allowed to drive home.    Special Instructions: Shower using CHG night before surgery and shower the day of surgery use CHG.  Use special wash - you have one bottle of CHG for all showers.  You should use approximately 1/2 of the bottle for each shower.  Thyroidectomy A thyroidectomy is a surgery that is done to remove all (total thyroidectomy) or part (subtotal thyroidectomy) of your thyroid gland. The thyroid is a butterfly-shaped gland that is located at the lower front of your neck. It produces a substance that helps to control certain body processes (thyroid hormone). The amount of thyroid gland tissue that is removed during your thyroidectomy depends on the reason you need the procedure. You may have a thyroidectomy to treat conditions including:  Thyroid nodules.  Thyroid cancer.  Benign thyroid tumors.  Goiter.  Overactive thyroid gland (hyperthyroidism). There are different ways to do a thyroidectomy:   Conventional thyroidectomy (open thyroidectomy). This procedure is the most common. In this procedure, the thyroid gland is removed through one surgical cut (incision) in  the neck.  Endoscopic thyroidectomy. This procedure is less invasive. In this procedure, there may be several smaller incisions in the neck, chest, or armpit. The surgeon uses a tiny camera and other assistive tools to remove the thyroid gland. LET [facility] CARE PROVIDER KNOW ABOUT:   Any allergies you have.  All medicines you are taking, including vitamins, herbs, eye drops, creams, and over-the-counter medicines.  Previous problems you or members of your family have had with the use of anesthetics.  Any blood disorders you have.  Previous surgeries you have had.  Medical conditions you have. RISKS AND COMPLICATIONS Generally, this is a safe procedure. However, problems can occur and include:  A decrease in parathyroid hormone levels (hypoparathyroidism). Your parathyroid glands are located behind your thyroid gland, and they maintain the calcium level in your body. If these glands are damaged during surgery, your calcium level will drop. This will make your nerves irritable and cause muscle spasms.  An increase in thyroid hormone.  Damage to the nerves of your voice box (larynx).  Bleeding.  Infection at the site of the incision or incisions.  Temporary breathing difficulties. This is a very rare complication. It usually goes away within weeks. BEFORE THE PROCEDURE  Your health care provider will perform a physical exam and assess your voice for vocal changes.  Ask your health care provider about:  Changing or stopping your regular medicines. This is especially important if you are taking diabetes medicines or blood thinners.  Taking medicines such  as aspirin and ibuprofen. These medicines can thin your blood. Do not take these medicines before your procedure if your health care provider instructs you not to.  Follow instructions from your health care provider about eating or drinking restrictions. PROCEDURE You will be given a medicine that makes you go to sleep  (general anesthetic). Depending on which type of thyroidectomy you have, this is what may happen during the procedure: Conventional Thyroidectomy  The surgeon will make an incision in the center of your lower neck.  The muscles in your neck will be separated to reveal your thyroid gland.  Part or all of your thyroid gland will be removed.  You may need a tube (catheter) at the incision site to drain blood and fluids that accumulate under the skin after the procedure.The catheter may have to stay in place for a day or two after the procedure.  The incision will be closed with stitches (sutures). Endoscopic Thyroidectomy  The surgeon will make several small incisions in your neck, chest, or armpit.  The surgeon will use a narrow tube with a light and camera at the end (endoscope). The surgeon will insert the endoscope into an incision.  Part or all of your thyroid gland will be removed.  You may need a catheter at the incision site to drain blood and fluids that accumulate under the skin after the procedure.The catheter may have to stay in place for a day or two after the procedure.  The incision will be closed with sutures. AFTER THE PROCEDURE  Your blood pressure, heart rate, breathing rate, and blood oxygen level will be monitored often until the medicines you were given have worn off.  Depending on the type of thyroidectomy you had, you may have:  A swollen neck.  Some mild neck pain.  A slightly sore throat.  A weak voice.  You will not be able to eat or drink until your health care provider says it is okay.  You may have a blood test to check the level of calcium in your body.  If you had a catheter put in during the procedure, it will usually be removed the next day.   This information is not intended to replace advice given to you by your health care provider. Make sure you discuss any questions you have with your health care provider.   Document Released:  03/14/2001 Document Revised: 02/02/2015 Document Reviewed: 02/18/2014 Elsevier Interactive Patient Education 2016 [employer] Thyroidectomy, Care After Refer to this sheet in the next few weeks. These instructions provide you with information about caring for yourself after your procedure. Your health care provider may also give you more specific instructions. Your treatment has been planned according to current medical practices, but problems sometimes occur. Call your health care provider if you have any problems or questions after your procedure. WHAT TO EXPECT AFTER THE PROCEDURE After your procedure, it is typical to have:  Mild pain in the neck or upper body, especially when swallowing.  A sore throat.  A weak voice. HOME CARE INSTRUCTIONS   Take medicines only as directed by your health care provider.  If your entire thyroid gland was removed, you may need to take thyroid hormone medicine from now on.  Do not take medicines that contain aspirin and ibuprofen until your health care provider says that you can. These medicines can increase your risk of bleeding.  Some pain medicines cause constipation. Drink enough fluid to keep your urine clear or pale yellow. This  can help to prevent constipation.  Start slowly with eating. You may need to have only liquids and soft foods for a few days or as directed by your health care provider.  Do not take baths, swim, or use a hot tub until your health care provider approves.  There are many different ways to close and cover an incision, including stitches (sutures), skin glue, and adhesive strips. Follow your health care provider's instructions for:  Incision care.  Bandage (dressing) changes and removal.  Incision closure removal.  Resume your usual activities as directed by your health care provider.  For the first 10 days after the procedure or as instructed by your health care provider:  Do not lift anything heavier than 20 lb  (9.1 kg).  Do not jog, swim, or do other strenuous exercises.  Do not play contact sports.  Keep all follow-up visits as directed by your health care provider. This is important. SEEK MEDICAL CARE IF:  The soreness in your throat gets worse.  You have increased pain at your incision or incisions.  You have increased bleeding from an incision.  Your incision becomes infected. Watch for:  Swelling.  Redness.  Warmth.  Pus.  You notice a bad smell coming from an incision or dressing.  You have a fever.  You feel lightheaded or faint.  You have numbness, tingling, or muscle spasms in your:  Arms.  Hands.  Feet.  Face.  You have trouble swallowing. SEEK IMMEDIATE MEDICAL CARE IF:   You develop a rash.  You have difficulty breathing.  You hear whistling noises coming from your chest.  You develop a cough that gets worse.  Your speech changes, or you have hoarseness that gets worse.   This information is not intended to replace advice given to you by your health care provider. Make sure you discuss any questions you have with your health care provider.   Document Released: 04/07/2005 Document Revised: 10/09/2014 Document Reviewed: 02/18/2014 Elsevier Interactive Patient Education [employer]

## 2016-05-08 ENCOUNTER — Other Ambulatory Visit: Payer: Self-pay

## 2016-05-08 ENCOUNTER — Encounter (HOSPITAL_COMMUNITY)
Admission: RE | Admit: 2016-05-08 | Discharge: 2016-05-08 | Disposition: A | Discharge status: Home / Self Care | Payer: BLUE CROSS/BLUE SHIELD | Source: Ambulatory Visit | Attending: General Surgery | Admitting: General Surgery

## 2016-05-08 ENCOUNTER — Encounter (HOSPITAL_COMMUNITY): Payer: Self-pay

## 2016-05-08 DIAGNOSIS — I10 Essential (primary) hypertension: Secondary | ICD-10-CM | POA: Insufficient documentation

## 2016-05-08 DIAGNOSIS — Z01810 Encounter for preprocedural cardiovascular examination: Secondary | ICD-10-CM | POA: Insufficient documentation

## 2016-05-08 DIAGNOSIS — Z01812 Encounter for preprocedural laboratory examination: Secondary | ICD-10-CM | POA: Diagnosis present

## 2016-05-08 HISTORY — DX: Adverse effect of unspecified anesthetic, initial encounter: T41.45XA

## 2016-05-08 HISTORY — DX: Other complications of anesthesia, initial encounter: T88.59XA

## 2016-05-08 HISTORY — DX: Essential (primary) hypertension: I10

## 2016-05-08 LAB — COMPREHENSIVE METABOLIC PANEL
ALK PHOS: 89 U/L (ref 38–126)
ALT: 19 U/L (ref 14–54)
ANION GAP: 8 (ref 5–15)
AST: 17 U/L (ref 15–41)
Albumin: 4 g/dL (ref 3.5–5.0)
BUN: 15 mg/dL (ref 6–20)
CALCIUM: 8.8 mg/dL — AB (ref 8.9–10.3)
CHLORIDE: 107 mmol/L (ref 101–111)
CO2: 24 mmol/L (ref 22–32)
Creatinine, Ser: 0.66 mg/dL (ref 0.44–1.00)
GFR calc non Af Amer: >60 mL/min (ref >60)
Glucose, Bld: 97 mg/dL (ref 65–99)
Potassium: 3.9 mmol/L (ref 3.5–5.1)
SODIUM: 139 mmol/L (ref 135–145)
Total Bilirubin: 0.6 mg/dL (ref 0.3–1.2)
Total Protein: 7 g/dL (ref 6.5–8.1)

## 2016-05-08 LAB — CBC WITH DIFFERENTIAL/PLATELET
Basophils Absolute: 0 10*3/uL (ref 0.0–0.1)
Basophils Relative: 1 %
EOS ABS: 0.1 10*3/uL (ref 0.0–0.7)
EOS PCT: 2 %
HCT: 42.6 % (ref 36.0–46.0)
Hemoglobin: 14.4 g/dL (ref 12.0–15.0)
LYMPHS ABS: 2.8 10*3/uL (ref 0.7–4.0)
Lymphocytes Relative: 42 %
MCH: 30.3 pg (ref 26.0–34.0)
MCHC: 33.8 g/dL (ref 30.0–36.0)
MCV: 89.5 fL (ref 78.0–100.0)
MONO ABS: 0.4 10*3/uL (ref 0.1–1.0)
MONOS PCT: 6 %
Neutro Abs: 3.2 10*3/uL (ref 1.7–7.7)
Neutrophils Relative %: 49 %
PLATELETS: 165 10*3/uL (ref 150–400)
RBC: 4.76 MIL/uL (ref 3.87–5.11)
RDW: 13.1 % (ref 11.5–15.5)
WBC: 6.5 10*3/uL (ref 4.0–10.5)

## 2016-05-12 ENCOUNTER — Ambulatory Visit (HOSPITAL_COMMUNITY)
Admission: RE | Admit: 2016-05-12 | Discharge: 2016-05-13 | Disposition: A | Discharge status: Home / Self Care | Payer: BLUE CROSS/BLUE SHIELD | Source: Ambulatory Visit | Attending: General Surgery | Admitting: General Surgery

## 2016-05-12 ENCOUNTER — Ambulatory Visit (HOSPITAL_COMMUNITY): Payer: BLUE CROSS/BLUE SHIELD | Admitting: Anesthesiology

## 2016-05-12 ENCOUNTER — Encounter (HOSPITAL_COMMUNITY): Payer: Self-pay

## 2016-05-12 ENCOUNTER — Encounter (HOSPITAL_COMMUNITY)
Admission: RE | Disposition: A | Discharge status: Home / Self Care | Payer: Self-pay | Source: Ambulatory Visit | Attending: General Surgery

## 2016-05-12 DIAGNOSIS — E042 Nontoxic multinodular goiter: Secondary | ICD-10-CM | POA: Diagnosis present

## 2016-05-12 DIAGNOSIS — F17200 Nicotine dependence, unspecified, uncomplicated: Secondary | ICD-10-CM | POA: Diagnosis not present

## 2016-05-12 DIAGNOSIS — Z79899 Other long term (current) drug therapy: Secondary | ICD-10-CM | POA: Diagnosis not present

## 2016-05-12 DIAGNOSIS — F419 Anxiety disorder, unspecified: Secondary | ICD-10-CM | POA: Insufficient documentation

## 2016-05-12 DIAGNOSIS — Z90710 Acquired absence of both cervix and uterus: Secondary | ICD-10-CM | POA: Diagnosis not present

## 2016-05-12 DIAGNOSIS — I10 Essential (primary) hypertension: Secondary | ICD-10-CM | POA: Diagnosis not present

## 2016-05-12 DIAGNOSIS — K219 Gastro-esophageal reflux disease without esophagitis: Secondary | ICD-10-CM | POA: Diagnosis not present

## 2016-05-12 DIAGNOSIS — E0520 Thyrotoxicosis with toxic multinodular goiter without thyrotoxic crisis or storm: Secondary | ICD-10-CM | POA: Diagnosis not present

## 2016-05-12 HISTORY — PX: THYROIDECTOMY: SHX17

## 2016-05-12 LAB — COMPREHENSIVE METABOLIC PANEL
ALT: 17 U/L (ref 14–54)
ANION GAP: 3 — AB (ref 5–15)
AST: 19 U/L (ref 15–41)
Albumin: 3.6 g/dL (ref 3.5–5.0)
Alkaline Phosphatase: 87 U/L (ref 38–126)
BUN: 17 mg/dL (ref 6–20)
CALCIUM: 8.1 mg/dL — AB (ref 8.9–10.3)
CHLORIDE: 108 mmol/L (ref 101–111)
CO2: 26 mmol/L (ref 22–32)
Creatinine, Ser: 0.77 mg/dL (ref 0.44–1.00)
GFR calc non Af Amer: >60 mL/min (ref >60)
Glucose, Bld: 146 mg/dL — ABNORMAL HIGH (ref 65–99)
Potassium: 4.5 mmol/L (ref 3.5–5.1)
SODIUM: 137 mmol/L (ref 135–145)
Total Bilirubin: 0.5 mg/dL (ref 0.3–1.2)
Total Protein: 6.6 g/dL (ref 6.5–8.1)

## 2016-05-12 SURGERY — THYROIDECTOMY
Anesthesia: General | Site: Neck

## 2016-05-12 MED ORDER — GLYCOPYRROLATE 0.2 MG/ML IJ SOLN
INTRAMUSCULAR | Status: DC | PRN
  Administered 2016-05-12: 0.6 mg via INTRAVENOUS

## 2016-05-12 MED ORDER — DIPHENHYDRAMINE HCL 50 MG/ML IJ SOLN: 25.0000 mg | Freq: Four times a day (QID) | INTRAMUSCULAR | Status: DC | PRN

## 2016-05-12 MED ORDER — NEOSTIGMINE METHYLSULFATE 10 MG/10ML IV SOLN
INTRAVENOUS | Status: AC
  Filled 2016-05-12: qty 1

## 2016-05-12 MED ORDER — MIDAZOLAM HCL 2 MG/2ML IJ SOLN
INTRAMUSCULAR | Status: AC
  Filled 2016-05-12: qty 2

## 2016-05-12 MED ORDER — SIMETHICONE 80 MG PO CHEW: 40.0000 mg | CHEWABLE_TABLET | Freq: Four times a day (QID) | ORAL | Status: DC | PRN

## 2016-05-12 MED ORDER — SODIUM CHLORIDE 0.9 % IR SOLN
Status: DC | PRN
  Administered 2016-05-12: 500 mL

## 2016-05-12 MED ORDER — HYDROMORPHONE HCL 1 MG/ML IJ SOLN
0.2500 mg | INTRAMUSCULAR | Status: DC | PRN
Start: 2016-05-12 — End: 2016-05-12

## 2016-05-12 MED ORDER — ROCURONIUM BROMIDE 50 MG/5ML IV SOLN
INTRAVENOUS | Status: AC
  Filled 2016-05-12: qty 1

## 2016-05-12 MED ORDER — DEXAMETHASONE SODIUM PHOSPHATE 4 MG/ML IJ SOLN
INTRAMUSCULAR | Status: AC
  Filled 2016-05-12: qty 1

## 2016-05-12 MED ORDER — CETYLPYRIDINIUM CHLORIDE 0.05 % MT LIQD
7.0000 mL | Freq: Two times a day (BID) | OROMUCOSAL | Status: DC
  Administered 2016-05-12 – 2016-05-13 (×3): 7 mL via OROMUCOSAL

## 2016-05-12 MED ORDER — ONDANSETRON HCL 4 MG/2ML IJ SOLN
4.0000 mg | Freq: Once | INTRAMUSCULAR | Status: AC
  Administered 2016-05-12: 4 mg via INTRAVENOUS

## 2016-05-12 MED ORDER — MENTHOL 3 MG MT LOZG: 1.0000 | LOZENGE | OROMUCOSAL | Status: DC | PRN

## 2016-05-12 MED ORDER — ACETAMINOPHEN 650 MG RE SUPP: 650.0000 mg | Freq: Four times a day (QID) | RECTAL | Status: DC | PRN

## 2016-05-12 MED ORDER — LEVOTHYROXINE SODIUM 100 MCG PO TABS
100.0000 ug | ORAL_TABLET | Freq: Every day | ORAL | Status: DC
  Administered 2016-05-13: 100 ug via ORAL
  Filled 2016-05-12: qty 1

## 2016-05-12 MED ORDER — ONDANSETRON HCL 4 MG/2ML IJ SOLN
4.0000 mg | Freq: Four times a day (QID) | INTRAMUSCULAR | Status: DC | PRN
  Administered 2016-05-13: 4 mg via INTRAVENOUS
  Filled 2016-05-12: qty 2

## 2016-05-12 MED ORDER — FENTANYL CITRATE (PF) 100 MCG/2ML IJ SOLN
INTRAMUSCULAR | Status: AC
  Filled 2016-05-12: qty 2

## 2016-05-12 MED ORDER — ONDANSETRON 4 MG PO TBDP: 4.0000 mg | ORAL_TABLET | Freq: Four times a day (QID) | ORAL | Status: DC | PRN

## 2016-05-12 MED ORDER — ROCURONIUM BROMIDE 100 MG/10ML IV SOLN
INTRAVENOUS | Status: DC | PRN
  Administered 2016-05-12: 30 mg via INTRAVENOUS
  Administered 2016-05-12: 5 mg via INTRAVENOUS

## 2016-05-12 MED ORDER — LISINOPRIL 10 MG PO TABS
10.0000 mg | ORAL_TABLET | Freq: Every day | ORAL | Status: DC
  Administered 2016-05-13: 10 mg via ORAL
  Filled 2016-05-12 (×2): qty 1

## 2016-05-12 MED ORDER — BUPIVACAINE HCL (PF) 0.5 % IJ SOLN
INTRAMUSCULAR | Status: DC | PRN
  Administered 2016-05-12: 4 mL

## 2016-05-12 MED ORDER — NEOSTIGMINE METHYLSULFATE 10 MG/10ML IV SOLN
INTRAVENOUS | Status: DC | PRN
  Administered 2016-05-12: 4 mg via INTRAVENOUS

## 2016-05-12 MED ORDER — DEXAMETHASONE SODIUM PHOSPHATE 4 MG/ML IJ SOLN
4.0000 mg | Freq: Once | INTRAMUSCULAR | Status: AC
  Administered 2016-05-12: 4 mg via INTRAVENOUS

## 2016-05-12 MED ORDER — LACTATED RINGERS IV SOLN
INTRAVENOUS | Status: DC
  Administered 2016-05-12: 21:00:00 via INTRAVENOUS

## 2016-05-12 MED ORDER — CEFAZOLIN SODIUM-DEXTROSE 2-4 GM/100ML-% IV SOLN
INTRAVENOUS | Status: AC
  Filled 2016-05-12: qty 100

## 2016-05-12 MED ORDER — PROPOFOL 10 MG/ML IV BOLUS
INTRAVENOUS | Status: DC | PRN
  Administered 2016-05-12: 140 mg via INTRAVENOUS

## 2016-05-12 MED ORDER — ARTIFICIAL TEARS OP OINT
TOPICAL_OINTMENT | OPHTHALMIC | Status: DC | PRN
  Administered 2016-05-12: 1 via OPHTHALMIC

## 2016-05-12 MED ORDER — FENTANYL CITRATE (PF) 100 MCG/2ML IJ SOLN
INTRAMUSCULAR | Status: DC | PRN
  Administered 2016-05-12 (×5): 50 ug via INTRAVENOUS
  Administered 2016-05-12: 25 ug via INTRAVENOUS

## 2016-05-12 MED ORDER — CEFAZOLIN SODIUM-DEXTROSE 2-3 GM-% IV SOLR
INTRAVENOUS | Status: DC | PRN
  Administered 2016-05-12: 2 g via INTRAVENOUS

## 2016-05-12 MED ORDER — CHLORHEXIDINE GLUCONATE CLOTH 2 % EX PADS: 6.0000 | MEDICATED_PAD | Freq: Once | CUTANEOUS | Status: DC

## 2016-05-12 MED ORDER — HYDROCODONE-ACETAMINOPHEN 5-325 MG PO TABS
1.0000 | ORAL_TABLET | ORAL | Status: DC | PRN
  Administered 2016-05-12: 1 via ORAL
  Filled 2016-05-12: qty 1

## 2016-05-12 MED ORDER — LIDOCAINE HCL (CARDIAC) 20 MG/ML IV SOLN
INTRAVENOUS | Status: DC | PRN
  Administered 2016-05-12: 25 mg via INTRAVENOUS
  Administered 2016-05-12: 50 mg via INTRAVENOUS

## 2016-05-12 MED ORDER — DIPHENHYDRAMINE HCL 25 MG PO CAPS: 25.0000 mg | ORAL_CAPSULE | Freq: Four times a day (QID) | ORAL | Status: DC | PRN

## 2016-05-12 MED ORDER — GLYCOPYRROLATE 0.2 MG/ML IJ SOLN
INTRAMUSCULAR | Status: AC
  Filled 2016-05-12: qty 3

## 2016-05-12 MED ORDER — FENTANYL CITRATE (PF) 250 MCG/5ML IJ SOLN
INTRAMUSCULAR | Status: AC
  Filled 2016-05-12: qty 5

## 2016-05-12 MED ORDER — HYDROMORPHONE HCL 1 MG/ML IJ SOLN
1.0000 mg | INTRAMUSCULAR | Status: DC | PRN
  Administered 2016-05-12 – 2016-05-13 (×3): 1 mg via INTRAVENOUS
  Filled 2016-05-12 (×3): qty 1

## 2016-05-12 MED ORDER — BUPIVACAINE HCL (PF) 0.5 % IJ SOLN
INTRAMUSCULAR | Status: AC
  Filled 2016-05-12: qty 30

## 2016-05-12 MED ORDER — LACTATED RINGERS IV SOLN
INTRAVENOUS | Status: DC
  Administered 2016-05-12: 09:00:00 via INTRAVENOUS
  Administered 2016-05-12: 1000 mL via INTRAVENOUS

## 2016-05-12 MED ORDER — MIDAZOLAM HCL 2 MG/2ML IJ SOLN
1.0000 mg | INTRAMUSCULAR | Status: DC | PRN
  Administered 2016-05-12: 2 mg via INTRAVENOUS

## 2016-05-12 MED ORDER — SUCCINYLCHOLINE CHLORIDE 20 MG/ML IJ SOLN
INTRAMUSCULAR | Status: AC
  Filled 2016-05-12: qty 1

## 2016-05-12 MED ORDER — PROPOFOL 10 MG/ML IV BOLUS
INTRAVENOUS | Status: AC
  Filled 2016-05-12: qty 40

## 2016-05-12 MED ORDER — LIDOCAINE HCL (PF) 1 % IJ SOLN
INTRAMUSCULAR | Status: AC
  Filled 2016-05-12: qty 5

## 2016-05-12 MED ORDER — ONDANSETRON HCL 4 MG/2ML IJ SOLN
INTRAMUSCULAR | Status: AC
  Filled 2016-05-12: qty 2

## 2016-05-12 MED ORDER — ALPRAZOLAM 0.5 MG PO TABS: 0.5000 mg | ORAL_TABLET | Freq: Every evening | ORAL | Status: DC | PRN

## 2016-05-12 MED ORDER — KETOROLAC TROMETHAMINE 30 MG/ML IJ SOLN
INTRAMUSCULAR | Status: AC
  Filled 2016-05-12: qty 1

## 2016-05-12 MED ORDER — CALCIUM CARBONATE 1250 (500 CA) MG PO TABS
1.0000 | ORAL_TABLET | Freq: Two times a day (BID) | ORAL | Status: DC
  Administered 2016-05-12 – 2016-05-13 (×2): 500 mg via ORAL
  Filled 2016-05-12 (×2): qty 1

## 2016-05-12 MED ORDER — KETOROLAC TROMETHAMINE 30 MG/ML IJ SOLN
30.0000 mg | Freq: Once | INTRAMUSCULAR | Status: AC
  Administered 2016-05-12: 30 mg via INTRAVENOUS

## 2016-05-12 MED ORDER — ENOXAPARIN SODIUM 40 MG/0.4ML ~~LOC~~ SOLN
40.0000 mg | SUBCUTANEOUS | Status: DC
  Administered 2016-05-13: 40 mg via SUBCUTANEOUS
  Filled 2016-05-12: qty 0.4

## 2016-05-12 MED ORDER — LORAZEPAM 2 MG/ML IJ SOLN: 1.0000 mg | INTRAMUSCULAR | Status: DC | PRN

## 2016-05-12 MED ORDER — ACETAMINOPHEN 325 MG PO TABS: 650.0000 mg | ORAL_TABLET | Freq: Four times a day (QID) | ORAL | Status: DC | PRN

## 2016-05-12 SURGICAL SUPPLY — 57 items
APPLIER CLIP 11 MED OPEN (CLIP)
APPLIER CLIP 9.375 SM OPEN (CLIP) ×6
ATTRACTOMAT 16X20 MAGNETIC DRP (DRAPES) ×3 IMPLANT
BAG HAMPER (MISCELLANEOUS) ×3 IMPLANT
BLADE SURG 15 STRL LF DISP TIS (BLADE) ×1 IMPLANT
BLADE SURG 15 STRL SS (BLADE) ×2
BLADE SURG SZ10 CARB STEEL (BLADE) ×3 IMPLANT
CHLORAPREP W/TINT 10.5 ML (MISCELLANEOUS) ×3 IMPLANT
CLIP APPLIE 11 MED OPEN (CLIP) IMPLANT
CLIP APPLIE 9.375 SM OPEN (CLIP) ×2 IMPLANT
CLOTH BEACON ORANGE TIMEOUT ST (SAFETY) ×3 IMPLANT
COVER LIGHT HANDLE STERIS (MISCELLANEOUS) ×6 IMPLANT
DECANTER SPIKE VIAL GLASS SM (MISCELLANEOUS) ×3 IMPLANT
DERMABOND ADVANCED (GAUZE/BANDAGES/DRESSINGS) ×2
DERMABOND ADVANCED .7 DNX12 (GAUZE/BANDAGES/DRESSINGS) ×1 IMPLANT
DRAPE PED LAPAROTOMY (DRAPES) ×3 IMPLANT
DRAPE PROXIMA HALF (DRAPES) ×6 IMPLANT
ELECT NEEDLE TIP 2.8 STRL (NEEDLE) ×3 IMPLANT
ELECT REM PT RETURN 9FT ADLT (ELECTROSURGICAL) ×3
ELECTRODE REM PT RTRN 9FT ADLT (ELECTROSURGICAL) ×1 IMPLANT
FORMALIN 10 PREFIL 120ML (MISCELLANEOUS) ×3 IMPLANT
GAUZE SPONGE 4X4 16PLY XRAY LF (GAUZE/BANDAGES/DRESSINGS) ×6 IMPLANT
GLOVE BIOGEL PI IND STRL 7.0 (GLOVE) ×2 IMPLANT
GLOVE BIOGEL PI INDICATOR 7.0 (GLOVE) ×4
GLOVE SURG SS PI 7.5 STRL IVOR (GLOVE) IMPLANT
GOWN STRL REUS W/ TWL LRG LVL3 (GOWN DISPOSABLE) ×2 IMPLANT
GOWN STRL REUS W/TWL LRG LVL3 (GOWN DISPOSABLE) ×7 IMPLANT
HEMOSTAT SURGICEL 4X8 (HEMOSTASIS) ×3 IMPLANT
KIT BLADEGUARD II DBL (SET/KITS/TRAYS/PACK) ×3 IMPLANT
KIT ROOM TURNOVER APOR (KITS) ×3 IMPLANT
MANIFOLD NEPTUNE II (INSTRUMENTS) ×3 IMPLANT
MARKER SKIN DUAL TIP RULER LAB (MISCELLANEOUS) ×3 IMPLANT
NEEDLE HYPO 25X1 1.5 SAFETY (NEEDLE) ×3 IMPLANT
NS IRRIG 1000ML POUR BTL (IV SOLUTION) ×3 IMPLANT
PACK BASIC III (CUSTOM PROCEDURE TRAY) ×2
PACK SRG BSC III STRL LF ECLPS (CUSTOM PROCEDURE TRAY) ×1 IMPLANT
PAD ARMBOARD 7.5X6 YLW CONV (MISCELLANEOUS) ×3 IMPLANT
PENCIL HANDSWITCHING (ELECTRODE) ×3 IMPLANT
SET BASIN LINEN APH (SET/KITS/TRAYS/PACK) ×3 IMPLANT
SHEARS HARMONIC 9CM CVD (BLADE) ×3 IMPLANT
SPONGE DRAIN TRACH 4X4 STRL 2S (GAUZE/BANDAGES/DRESSINGS) ×3 IMPLANT
SPONGE INTESTINAL PEANUT (DISPOSABLE) ×24 IMPLANT
STAPLER VISISTAT 35W (STAPLE) ×3 IMPLANT
SUT ETHILON 4 0 PS 2 18 (SUTURE) IMPLANT
SUT SILK 2 0 (SUTURE) ×2
SUT SILK 2-0 18XBRD TIE 12 (SUTURE) ×1 IMPLANT
SUT SILK 3 0 (SUTURE)
SUT SILK 3-0 18XBRD TIE 12 (SUTURE) IMPLANT
SUT VIC AB 2-0 CT2 27 (SUTURE) ×3 IMPLANT
SUT VIC AB 3-0 SH 27 (SUTURE) ×2
SUT VIC AB 3-0 SH 27X BRD (SUTURE) ×1 IMPLANT
SUT VIC AB 4-0 PS2 27 (SUTURE) ×3 IMPLANT
SYR CONTROL 10ML LL (SYRINGE) ×3 IMPLANT
SYSTEM CHEST DRAIN TLS 7FR (DRAIN) IMPLANT
TOWEL OR 17X26 4PK STRL BLUE (TOWEL DISPOSABLE) ×3 IMPLANT
YANKAUER SUCT 12FT TUBE ARGYLE (SUCTIONS) ×3 IMPLANT
YANKAUER SUCT BULB TIP 10FT TU (MISCELLANEOUS) ×3 IMPLANT

## 2016-05-12 NOTE — Transfer of Care (Signed)
Immediate Anesthesia Transfer of Care Note  Patient: Linda Mayer  Procedure(s) Performed: Procedure(s): THYROIDECTOMY (N/A)  Patient Location: PACU  Anesthesia Type:General  Level of Consciousness: awake, oriented and patient cooperative  Airway & Oxygen Therapy: Patient Spontanous Breathing and aerosol face mask  Post-op Assessment: Report given to RN and Post -op Vital signs reviewed and stable  Post vital signs: Reviewed and stable  Last Vitals:  Vitals:   05/12/16 0700 05/12/16 0715  BP: (!) 156/77 133/84  Pulse:    Resp: (!) 24 (!) 25  Temp:      Last Pain:  Vitals:   05/12/16 0622  TempSrc: Oral         Complications: No apparent anesthesia complications

## 2016-05-12 NOTE — Anesthesia Preprocedure Evaluation (Signed)
Anesthesia Evaluation  Patient identified by MRN, date of birth, ID band Patient awake    History of Anesthesia Complications (+) history of anesthetic complications  Airway Mallampati: II  TM Distance: >3 FB     Dental  (+) Poor Dentition, Chipped, Dental Advisory Given,    Pulmonary Current Smoker,  Slightly raspy voice    Pulmonary exam normal        Cardiovascular hypertension,  Rhythm:Regular Rate:Normal     Neuro/Psych Anxiety    GI/Hepatic GERD  Controlled,  Endo/Other  Hyperthyroidism   Renal/GU      Musculoskeletal   Abdominal   Peds  Hematology   Anesthesia Other Findings   Reproductive/Obstetrics                             Anesthesia Physical Anesthesia Plan  ASA: II  Anesthesia Plan: General   Post-op Pain Management:    Induction: Intravenous  Airway Management Planned: Oral ETT  Additional Equipment:   Intra-op Plan:   Post-operative Plan: Extubation in OR  Informed Consent: I have reviewed the patients History and Physical, chart, labs and discussed the procedure including the risks, benefits and alternatives for the proposed anesthesia with the patient or authorized representative who has indicated his/her understanding and acceptance.     Plan Discussed with:   Anesthesia Plan Comments:         Anesthesia Quick Evaluation

## 2016-05-12 NOTE — Interval H&P Note (Signed)
History and Physical Interval Note:  05/12/2016 7:13 AM  Linda Mayer  has presented today for surgery, with the diagnosis of multi-nodular goiter  The various methods of treatment have been discussed with the patient and family. After consideration of risks, benefits and other options for treatment, the patient has consented to  Procedure(s): THYROIDECTOMY (N/A) as a surgical intervention .  The patient's history has been reviewed, patient examined, no change in status, stable for surgery.  I have reviewed the patient's chart and labs.  Questions were answered to the patient's satisfaction.     Franky MachoJENKINS,Amarachukwu Lakatos A

## 2016-05-12 NOTE — Op Note (Signed)
Patient:  Linda Mayer  DOB:  27-Nov-1955  MRN:  [id number]   Preop Diagnosis:  Multinodular goiter  Postop Diagnosis:  Same  Procedure:  Total thyroidectomy  Surgeon:  Franky MachoMark Adya Wirz, M.D.  Assistant: Satira MccallumJason Davis, M.D.  Anes:  Gen. endotracheal  Indications:  Patient is a 60 year old white female who presents with an enlarging multinodular goiter. Fine-needle aspiration showed follicular neoplasm. The patient now comes to the operating room for total thyroidectomy. The risks and benefits of the procedure including bleeding, infection, nerve injury, voice changes, and the possibility of malignancy were fully explained to the patient, who gave informed consent.  Procedure note:  The patient was placed the supine position. After induction of general endotracheal anesthesia, the neck was prepped and draped using the usual sterile technique with DuraPrep. Surgical site confirmation was performed.  A transverse incision was made just superior to the jugular notch. The dissection was taken down through the platysma. A superior flap was formed to the cricoid cartilage and an inferior flap formed inferiorly to the chest wall. The strap muscles were divided longitudinally. We first dissected out the left lobe of the thyroid gland. The middle thyroidal artery and vein, suspensory ligament of Gery PrayBarry, and the inferior thyroidal artery wall divided and ligated using clips and the harmonic scalpel. Care was taken to avoid both inferior and superior parathyroid glands. The left lobe was then rotated medially and freed away from the trachea. There was then divided and sent to pathology further examination. The right lobe of the thyroid gland was enlarged with some substernal extension. Again, the middle thyroidal artery and vein, suspensory ligament of Gery PrayBarry, and the inferior thyroidal artery were all ligated and divided using the harmonic scalpel and small clips. Care was taken to avoid the parathyroids. Care  was taken to avoid the recurrent laryngeal nerves. The right lobe and isthmus were then sent to pathology further examination. The beds were irrigated with normal saline. Surgicel was placed in both thyroid lobe beds. The strap muscles reapproximated longitudinally using a 2-0 Vicryl running suture. The platysma was reapproximated using 3-0 Vicryl running suture. The skin was closed using a 4-0 Vicryl subcuticular suture. 0.5% Sensorcaine was instilled into the surrounding wound. Dermabond was then applied.  All tape and needle counts were correct at the end of the procedure. Patient was extubated in the operating room and transferred to PACU in stable condition. She was able to phonate E without difficulty.  Complications:  None  EBL:  80 mL  Specimen:  Left lobe of thyroid, right lobe and isthmus of thyroid

## 2016-05-12 NOTE — Anesthesia Procedure Notes (Signed)
Procedure Name: Intubation Date/Time: 05/12/2016 7:35 AM Performed by: Pernell DupreADAMS, Tauri Ethington A Pre-anesthesia Checklist: Patient identified, Patient being monitored, Timeout performed, Emergency Drugs available and Suction available Patient Re-evaluated:Patient Re-evaluated prior to inductionOxygen Delivery Method: Circle system utilized Preoxygenation: Pre-oxygenation with 100% oxygen Intubation Type: IV induction Ventilation: Mask ventilation without difficulty Laryngoscope Size: 3 and Miller Grade View: Grade II Tube type: Oral Tube size: 7.0 mm Number of attempts: 1 Airway Equipment and Method: Stylet Placement Confirmation: ETT inserted through vocal cords under direct vision,  positive ETCO2 and breath sounds checked- equal and bilateral Secured at: 21 cm Tube secured with: Tape Dental Injury: Teeth and Oropharynx as per pre-operative assessment

## 2016-05-12 NOTE — Anesthesia Postprocedure Evaluation (Signed)
Anesthesia Post Note  Patient: Linda Mayer  Procedure(s) Performed: Procedure(s) (LRB): THYROIDECTOMY (N/A)  Patient location during evaluation: PACU Anesthesia Type: General Level of consciousness: awake and alert and oriented Pain management: pain level controlled Vital Signs Assessment: post-procedure vital signs reviewed and stable Respiratory status: spontaneous breathing Cardiovascular status: stable Postop Assessment: no signs of nausea or vomiting Anesthetic complications: no    Last Vitals:  Vitals:   05/12/16 0945 05/12/16 1000  BP: (!) 187/78 (!) 167/83  Pulse: 71 64  Resp: 12 14  Temp:      Last Pain:  Vitals:   05/12/16 0622  TempSrc: Oral                 Khalaya Mcgurn A

## 2016-05-12 NOTE — Plan of Care (Signed)
Problem: Pain Management: Goal: General experience of comfort will improve Outcome: Progressing Patient reports decreased pain with prescribed pain medication and ice pack as needed.

## 2016-05-13 DIAGNOSIS — E0520 Thyrotoxicosis with toxic multinodular goiter without thyrotoxic crisis or storm: Secondary | ICD-10-CM | POA: Diagnosis not present

## 2016-05-13 LAB — CBC
HCT: 38 % (ref 36.0–46.0)
Hemoglobin: 12.7 g/dL (ref 12.0–15.0)
MCH: 30.1 pg (ref 26.0–34.0)
MCHC: 33.4 g/dL (ref 30.0–36.0)
MCV: 90 fL (ref 78.0–100.0)
Platelets: 171 10*3/uL (ref 150–400)
RBC: 4.22 MIL/uL (ref 3.87–5.11)
RDW: 13.1 % (ref 11.5–15.5)
WBC: 10.4 10*3/uL (ref 4.0–10.5)

## 2016-05-13 LAB — COMPREHENSIVE METABOLIC PANEL
ALBUMIN: 3.5 g/dL (ref 3.5–5.0)
ALK PHOS: 82 U/L (ref 38–126)
ALT: 14 U/L (ref 14–54)
ANION GAP: 5 (ref 5–15)
AST: 16 U/L (ref 15–41)
BILIRUBIN TOTAL: 0.7 mg/dL (ref 0.3–1.2)
BUN: 16 mg/dL (ref 6–20)
CALCIUM: 8.9 mg/dL (ref 8.9–10.3)
CO2: 27 mmol/L (ref 22–32)
Chloride: 107 mmol/L (ref 101–111)
Creatinine, Ser: 0.68 mg/dL (ref 0.44–1.00)
GFR calc Af Amer: >60 mL/min (ref >60)
GLUCOSE: 106 mg/dL — AB (ref 65–99)
POTASSIUM: 3.9 mmol/L (ref 3.5–5.1)
Sodium: 139 mmol/L (ref 135–145)
TOTAL PROTEIN: 6.3 g/dL — AB (ref 6.5–8.1)

## 2016-05-13 MED ORDER — LEVOTHYROXINE SODIUM 150 MCG PO TABS: 150.0000 ug | ORAL_TABLET | Freq: Every day | ORAL | 3 refills | Status: DC

## 2016-05-13 MED ORDER — CALCIUM CARBONATE 1250 (500 CA) MG PO TABS: 1.0000 | ORAL_TABLET | Freq: Two times a day (BID) | ORAL | 2 refills | Status: DC

## 2016-05-13 MED ORDER — HYDROCODONE-ACETAMINOPHEN 5-325 MG PO TABS: 1.0000 | ORAL_TABLET | ORAL | 0 refills | Status: DC | PRN

## 2016-05-13 NOTE — Discharge Instructions (Signed)
Thyroidectomy, Care After °Refer to this sheet in the next few weeks. These instructions provide you with information about caring for yourself after your procedure. Your health care provider may also give you more specific instructions. Your treatment has been planned according to current medical practices, but problems sometimes occur. Call your health care provider if you have any problems or questions after your procedure. °WHAT TO EXPECT AFTER THE PROCEDURE °After your procedure, it is typical to have: °· Mild pain in the neck or upper body, especially when swallowing. °· A sore throat. °· A weak voice. °HOME CARE INSTRUCTIONS  °· Take medicines only as directed by your health care provider. °· If your entire thyroid gland was removed, you may need to take thyroid hormone medicine from now on. °· Do not take medicines that contain aspirin and ibuprofen until your health care provider says that you can. These medicines can increase your risk of bleeding. °· Some pain medicines cause constipation. Drink enough fluid to keep your urine clear or pale yellow. This can help to prevent constipation. °· Start slowly with eating. You may need to have only liquids and soft foods for a few days or as directed by your health care provider. °· Do not take baths, swim, or use a hot tub until your health care provider approves. °· There are many different ways to close and cover an incision, including stitches (sutures), skin glue, and adhesive strips. Follow your health care provider's instructions for: °¨ Incision care. °¨ Bandage (dressing) changes and removal. °¨ Incision closure removal. °· Resume your usual activities as directed by your health care provider. °· For the first 10 days after the procedure or as instructed by your health care provider: °¨ Do not lift anything heavier than 20 lb (9.1 kg). °¨ Do not jog, swim, or do other strenuous exercises. °¨ Do not play contact sports. °· Keep all follow-up visits as  directed by your health care provider. This is important. °SEEK MEDICAL CARE IF: °· The soreness in your throat gets worse. °· You have increased pain at your incision or incisions. °· You have increased bleeding from an incision. °· Your incision becomes infected. Watch for: °¨ Swelling. °¨ Redness. °¨ Warmth. °¨ Pus. °· You notice a bad smell coming from an incision or dressing. °· You have a fever. °· You feel lightheaded or faint. °· You have numbness, tingling, or muscle spasms in your: °¨ Arms. °¨ Hands. °¨ Feet. °¨ Face. °· You have trouble swallowing. °SEEK IMMEDIATE MEDICAL CARE IF:  °· You develop a rash. °· You have difficulty breathing. °· You hear whistling noises coming from your chest. °· You develop a cough that gets worse. °· Your speech changes, or you have hoarseness that gets worse. °  °This information is not intended to replace advice given to you by your health care provider. Make sure you discuss any questions you have with your health care provider. °  °Document Released: 04/07/2005 Document Revised: 10/09/2014 Document Reviewed: 02/18/2014 °Elsevier Interactive Patient Education ©2016 [employer] ° °

## 2016-05-13 NOTE — Discharge Summary (Signed)
Physician Discharge Summary  Patient ID: Linda Mayer MRN: [id number] DOB/AGE: [birth year] 60 y.o.  Admit date: 05/12/2016 Discharge date: 05/13/2016  Admission Diagnoses:Multinodular goiter  Discharge Diagnoses: Same Active Problems:   Multinodular goiter   Discharged Condition: good  Hospital Course: Patient is a 60 year old white female who presented to the operating room for a total thyroidectomy due to multinodular goiter and follicular neoplasm of undetermined etiology. She underwent total thyroidectomy on 05/12/2016. She tolerated the procedure well. Her postoperative course was unremarkable. Her calciums remained within normal limits. She was able to phonate E without difficulty. She is being discharged home on 05/13/2016 in good and improving condition.  Treatments: surgery: Total thyroidectomy on 05/12/2016  Discharge Exam: Blood pressure (!) 147/67, pulse 61, temperature 98.2 F (36.8 C), resp. rate 20, height 5\' 5"  (1.651 m), weight 72.6 kg (160 lb), SpO2 90 %. General appearance: alert, cooperative and no distress Neck: Incision healing well. Minimal swelling or ecchymosis. Resp: clear to auscultation bilaterally Cardio: regular rate and rhythm, S1, S2 normal, no murmur, click, rub or gallop  Disposition:  home     Medication List    TAKE these medications   ALPRAZolam 0.5 MG tablet Commonly known as:  XANAX Take 0.5 mg by mouth at bedtime as needed.   calcium carbonate 1250 (500 Ca) MG tablet Commonly known as:  OS-CAL - dosed in mg of elemental calcium Take 1 tablet (500 mg of elemental calcium total) by mouth 2 (two) times daily with a meal.   HYDROcodone-acetaminophen 5-325 MG tablet Commonly known as:  NORCO Take 1-2 tablets by mouth every 4 (four) hours as needed for moderate pain.   levothyroxine 150 MCG tablet Commonly known as:  SYNTHROID Take 1 tablet (150 mcg total) by mouth daily before breakfast.   lisinopril 10 MG tablet Commonly known  as:  PRINIVIL,ZESTRIL Take 10 mg by mouth daily.   omeprazole 20 MG capsule Commonly known as:  PRILOSEC Take 20 mg by mouth daily.      Follow-up Information    Dalia HeadingJENKINS,Jatavius Ellenwood A, MD. Call on 05/18/2016.   Specialty:  General Surgery Contact information: [address] [place] [id number] [phone]           Signed: Franky MachoJENKINS,Terez Freimark A 05/13/2016, 11:19 AM

## 2016-05-13 NOTE — Progress Notes (Signed)
Discharge instructions given on medications,and follow up visits,patient verbalized understanding. Prescriptions sent with patient.Vital signs stable. Accompanied by staff to an awaiting vehicle. 

## 2016-05-18 ENCOUNTER — Encounter (HOSPITAL_COMMUNITY): Payer: Self-pay | Admitting: General Surgery

## 2016-07-03 ENCOUNTER — Ambulatory Visit: Payer: BLUE CROSS/BLUE SHIELD | Admitting: "Endocrinology

## 2017-03-09 ENCOUNTER — Other Ambulatory Visit: Payer: Self-pay | Admitting: "Endocrinology

## 2017-03-09 DIAGNOSIS — E042 Nontoxic multinodular goiter: Secondary | ICD-10-CM

## 2017-03-11 LAB — T4, FREE: FREE T4: 2.2 ng/dL — AB (ref 0.8–1.8)

## 2017-03-11 LAB — TSH: TSH: 0.01 m[IU]/L — AB

## 2017-04-17 ENCOUNTER — Ambulatory Visit: Payer: BLUE CROSS/BLUE SHIELD | Admitting: "Endocrinology

## 2017-05-03 ENCOUNTER — Encounter: Payer: Self-pay | Admitting: "Endocrinology

## 2017-05-03 ENCOUNTER — Ambulatory Visit (INDEPENDENT_AMBULATORY_CARE_PROVIDER_SITE_OTHER): Payer: PRIVATE HEALTH INSURANCE | Admitting: "Endocrinology

## 2017-05-03 VITALS — BP 128/77 | HR 82 | Ht 64.0 in | Wt 151.0 lb

## 2017-05-03 DIAGNOSIS — E890 Postprocedural hypothyroidism: Secondary | ICD-10-CM | POA: Diagnosis not present

## 2017-05-03 MED ORDER — LEVOTHYROXINE SODIUM 125 MCG PO TABS: 125.0000 ug | ORAL_TABLET | Freq: Every day | ORAL | 1 refills | Status: DC

## 2017-05-03 NOTE — Progress Notes (Signed)
Subjective:    Patient ID: Linda Mayer, female    DOB: Feb 17, 1956, PCP Pllc, [facility]   Past Medical History:  Diagnosis Date  . Anxiety   . Complication of anesthesia    Pt states that her BP drops really low after anesthesia  . GERD (gastroesophageal reflux disease)   . Hypertension    Past Surgical History:  Procedure Laterality Date  . BARTHOLIN CYST MARSUPIALIZATION     Removal  . THYROIDECTOMY N/A 05/12/2016   Procedure: THYROIDECTOMY;  Surgeon: Franky MachoMark Jenkins, MD;  Location: [facility];  Service: General;  Laterality: N/A;  . VESICOVAGINAL FISTULA CLOSURE W/ TAH     Social History   Social History  . Marital status: Married    Spouse name: N/A  . Number of children: N/A  . Years of education: N/A   Social History Main Topics  . Smoking status: Current Every Day Smoker  . Smokeless tobacco: Never Used  . Alcohol use No  . Drug use: No  . Sexual activity: Not Asked   Other Topics Concern  . None   Social History Narrative  . None   Outpatient Encounter Prescriptions as of 05/03/2017  Medication Sig  . ALPRAZolam (XANAX) 0.5 MG tablet Take 0.5 mg by mouth at bedtime as needed.    . calcium carbonate (OS-CAL - DOSED IN MG OF ELEMENTAL CALCIUM) 1250 (500 Ca) MG tablet Take 1 tablet (500 mg of elemental calcium total) by mouth 2 (two) times daily with a meal.  . levothyroxine (SYNTHROID, LEVOTHROID) 125 MCG tablet Take 1 tablet (125 mcg total) by mouth daily before breakfast.  . lisinopril (PRINIVIL,ZESTRIL) 10 MG tablet Take 10 mg by mouth daily.  . [DISCONTINUED] HYDROcodone-acetaminophen (NORCO) 5-325 MG tablet Take 1-2 tablets by mouth every 4 (four) hours as needed for moderate pain.  . [DISCONTINUED] levothyroxine (SYNTHROID) 150 MCG tablet Take 1 tablet (150 mcg total) by mouth daily before breakfast.  . [DISCONTINUED] omeprazole (PRILOSEC) 20 MG capsule Take 20 mg by mouth daily.     No facility-administered encounter medications on file  as of 05/03/2017.    ALLERGIES: No Known Allergies VACCINATION STATUS:  There is no immunization history on file for this patient.  HPI Linda Mayer is a 61 yr old female with medical hx as above. she is Here to follow-up for her history of multinodular goiter . She underwent total thyroidectomy on 05/12/2016 with benign outcomes. - She was started on thyroid hormone replacement currently at 150 g by mouth every morning. Patient failed to show up for her clinic follow-up here. - Her most recent thyroid function tests are consistent with over replacement, she is also on calcium carbonate 1250 g by mouth twice a day. she denies dysphagia, SOB, nor voice change. She feels better, no new complaints. she denies cold/heat intolerance.  She continues to smoke heavily.   Review of Systems Constitutional: + Most 10 pounds of weight  is her last visit,  no fatigue, no subjective hyperthermia/hypothermia Eyes: no blurry vision, no xerophthalmia ENT: no sore throat, no nodules palpated in throat, no dysphagia/odynophagia, no hoarseness Cardiovascular: no CP/SOB/palpitations/leg swelling Respiratory: no cough/SOB Gastrointestinal: no N/V/D/C Musculoskeletal: no muscle/joint aches Skin: no rashes Neurological: no tremors/numbness/tingling/dizziness Psychiatric: no depression/anxiety  Objective:    BP 128/77   Pulse 82   Ht 5\' 4"  (1.626 m)   Wt 151 lb (68.5 kg)   BMI 25.92 kg/m   Wt Readings from Last 3 Encounters:  05/03/17 151 lb (  68.5 kg)  05/12/16 160 lb (72.6 kg)  05/08/16 159 lb (72.1 kg)    Physical Exam Constitutional: overweight, in NAD Eyes: PERRLA, EOMI, no exophthalmos ENT: moist mucous membranes, + post thyroidectomy scar on anterior lower neck,  no cervical lymphadenopathy Cardiovascular: RRR, No MRG Respiratory: CTA B Gastrointestinal: abdomen soft, NT, ND, BS+ Musculoskeletal: no deformities, strength intact in all 4 Skin: moist, warm, no rashes Neurological: no  tremor with outstretched hands, DTR normal in all 4   Recent Results (from the past 2160 hour(s))  T4, Free     Status: Abnormal   Collection Time: 03/09/17  9:44 AM  Result Value Ref Range   Free T4 2.2 (H) 0.8 - 1.8 ng/dL  TSH     Status: Abnormal   Collection Time: 03/09/17 11:53 AM  Result Value Ref Range   TSH 0.01 (L) mIU/L    Comment:   Reference Range   > or = 20 Years  0.40-4.50   Pregnancy Range First trimester  0.26-2.66 Second trimester 0.55-2.73 Third trimester  0.43-2.91      -Enlarging 1.6 cm nodule on the right lobe and enlarging 2 cm nodule on the left lobe-on thyroid ultrasound from 01/12/2016  - FNA on 01/25/2006 showed cellular atypia of undetermined significance.  -Surgical sample from 05/12/2016-no evidence of malignancy.   Assessment & Plan:   1. Postsurgical hypothyroidism : - She underwent thyroidectomy for abnormal FNA on 05/12/2016. Her current thyroid function tests are consistent with over replacement. I advised her to lower her levothyroxine to 125 g by mouth every morning.   - We discussed about correct intake of levothyroxine, at fasting, with water, separated by at least 30 minutes from breakfast, and separated by more than 4 hours from calcium, iron, multivitamins, acid reflux medications (PPIs). -Patient is made aware of the fact that thyroid hormone replacement is needed for life, dose to be adjusted by periodic monitoring of thyroid function tests. - I advised her to lower her calcium carbonate 1250 minute grams by mouth daily until repeat labs before her next visit in 3 months.  -She is counseled about smoking cessation.  - I advised patient to maintain close follow [facility] for primary care needs. Follow up plan: Return in about 3 months (around 08/03/2017) for follow up with pre-visit labs.  Marquis LunchGebre Nida, MD Phone: [phone]  Fax: [phone]   05/03/2017, 3:35 PM

## 2017-05-16 ENCOUNTER — Other Ambulatory Visit: Payer: Self-pay | Admitting: General Surgery

## 2017-05-28 ENCOUNTER — Other Ambulatory Visit: Payer: Self-pay

## 2017-05-28 MED ORDER — LEVOTHYROXINE SODIUM 125 MCG PO TABS: 125.0000 ug | ORAL_TABLET | Freq: Every day | ORAL | 1 refills | Status: DC

## 2017-06-22 ENCOUNTER — Other Ambulatory Visit (HOSPITAL_COMMUNITY): Payer: Self-pay | Admitting: Orthopedic Surgery

## 2017-06-22 DIAGNOSIS — M25512 Pain in left shoulder: Secondary | ICD-10-CM

## 2017-06-28 ENCOUNTER — Ambulatory Visit (HOSPITAL_COMMUNITY)
Admission: RE | Admit: 2017-06-28 | Discharge: 2017-06-28 | Disposition: A | Discharge status: Home / Self Care | Payer: 59 | Source: Ambulatory Visit | Attending: Orthopedic Surgery | Admitting: Orthopedic Surgery

## 2017-07-11 ENCOUNTER — Ambulatory Visit (HOSPITAL_COMMUNITY): Payer: 59 | Attending: Orthopedic Surgery

## 2017-07-11 ENCOUNTER — Encounter (HOSPITAL_COMMUNITY): Payer: Self-pay

## 2017-08-06 ENCOUNTER — Encounter: Payer: Self-pay | Admitting: "Endocrinology

## 2017-08-06 ENCOUNTER — Ambulatory Visit: Payer: PRIVATE HEALTH INSURANCE | Admitting: "Endocrinology

## 2017-09-28 ENCOUNTER — Other Ambulatory Visit: Payer: Self-pay

## 2017-09-28 MED ORDER — LEVOTHYROXINE SODIUM 125 MCG PO TABS: 125.0000 ug | ORAL_TABLET | Freq: Every day | ORAL | 0 refills | Status: DC

## 2017-11-25 IMAGING — US US BREAST*L* LIMITED INC AXILLA
1 series · 8 of 8 positions shown · non-contrast
Comparison: Previous exam(s).

CLINICAL DATA: Possible mass in the upper outer left breast on a
recent screening mammogram.

EXAM:
2D DIGITAL DIAGNOSTIC LEFT MAMMOGRAM WITH CAD AND ADJUNCT TOMO
ULTRASOUND LEFT BREAST

[Series 1: us breast*left* limited inc axilla · 0.07mm/px · 8 of 8 slices shown]
[im 1/8]
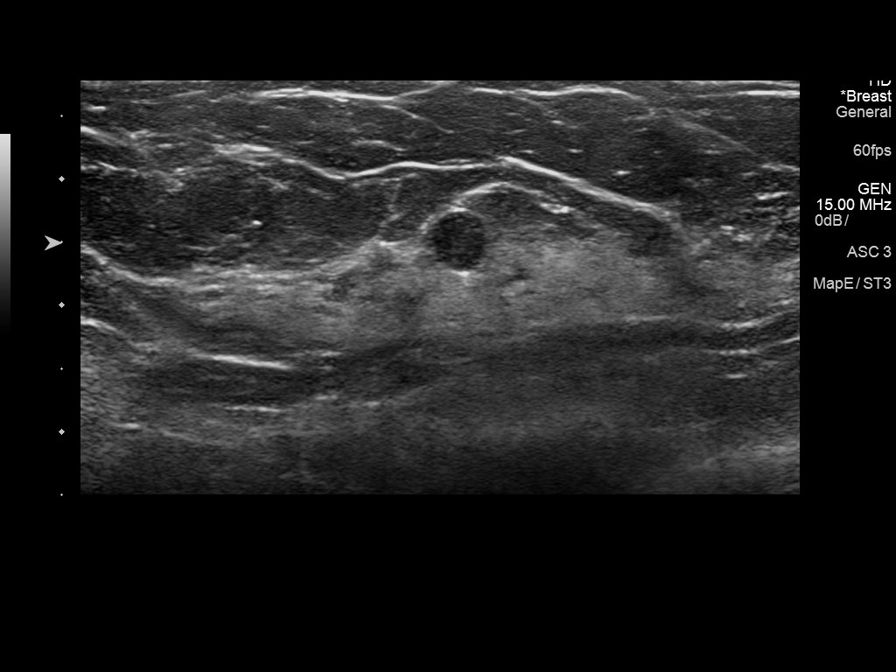
[im 2/8]
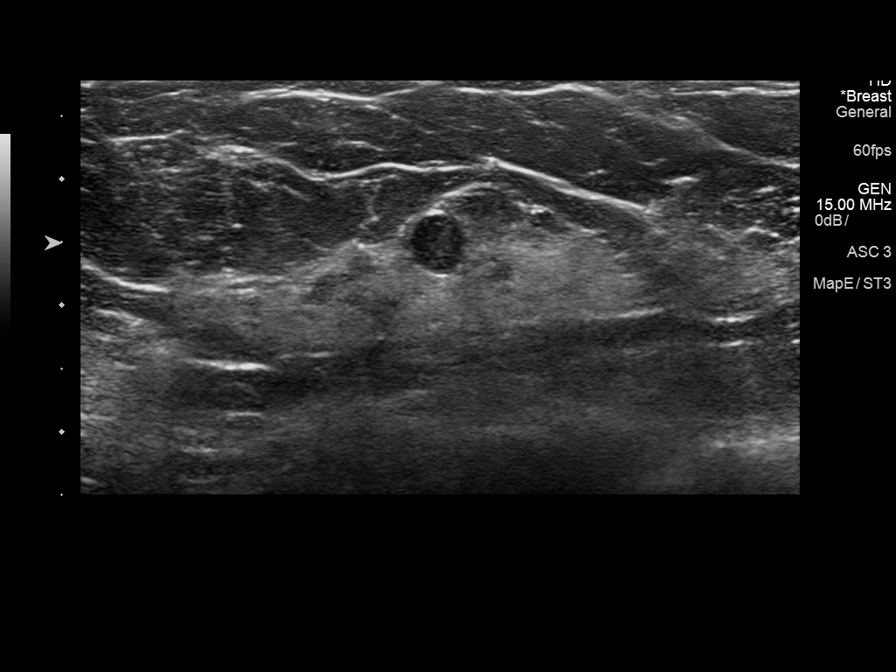
[im 3/8]
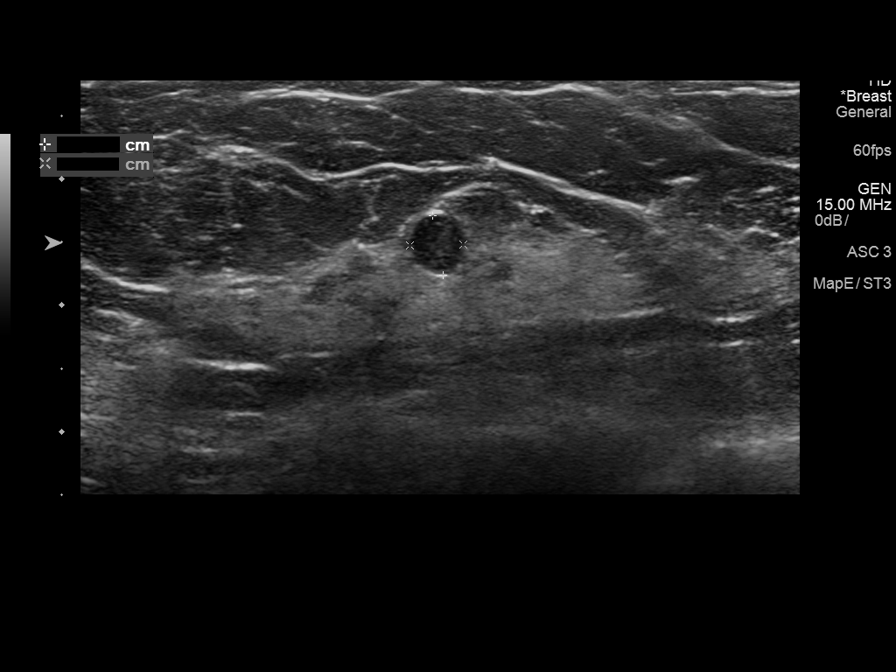
[im 4/8]
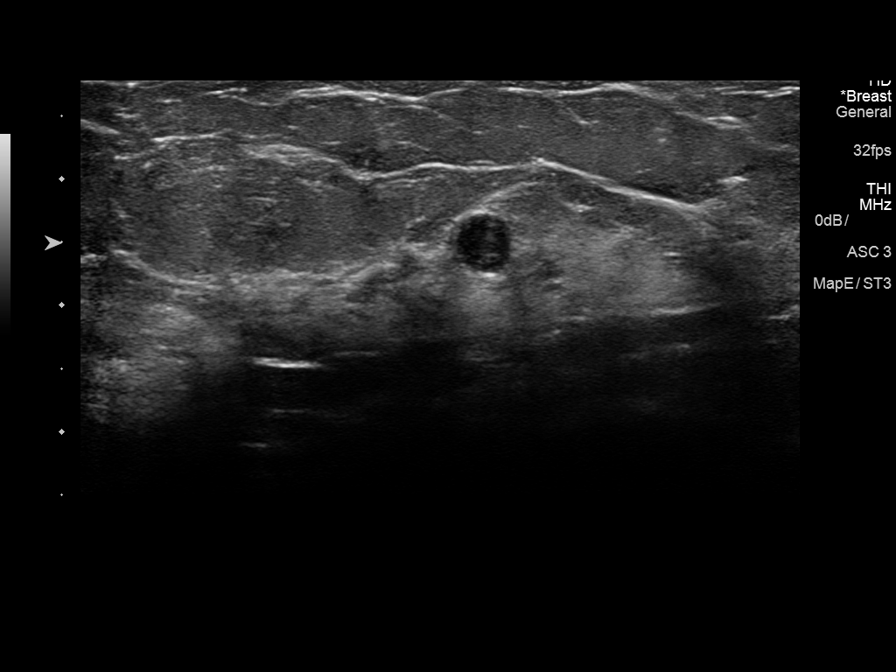
[im 5/8]
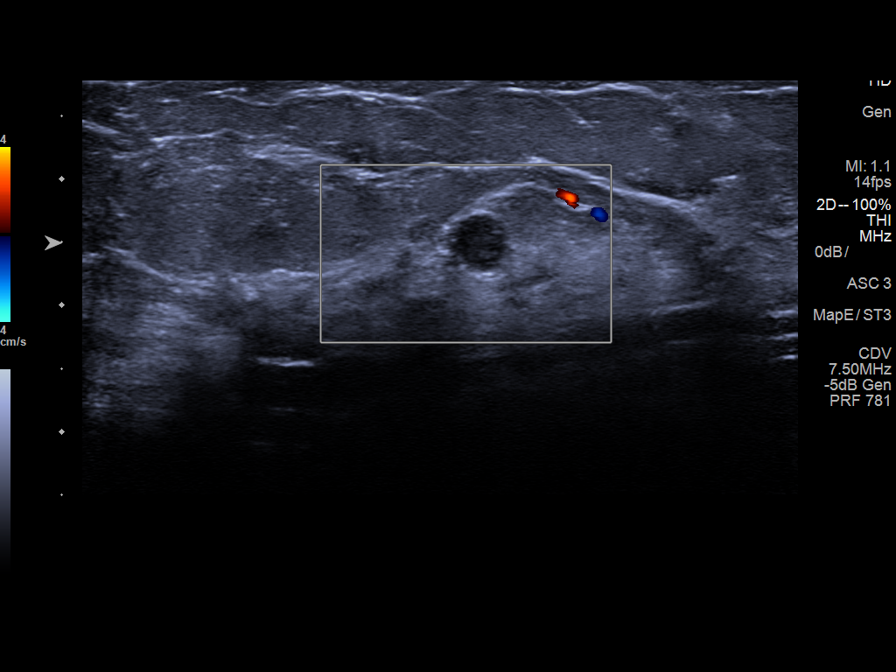
[im 6/8]
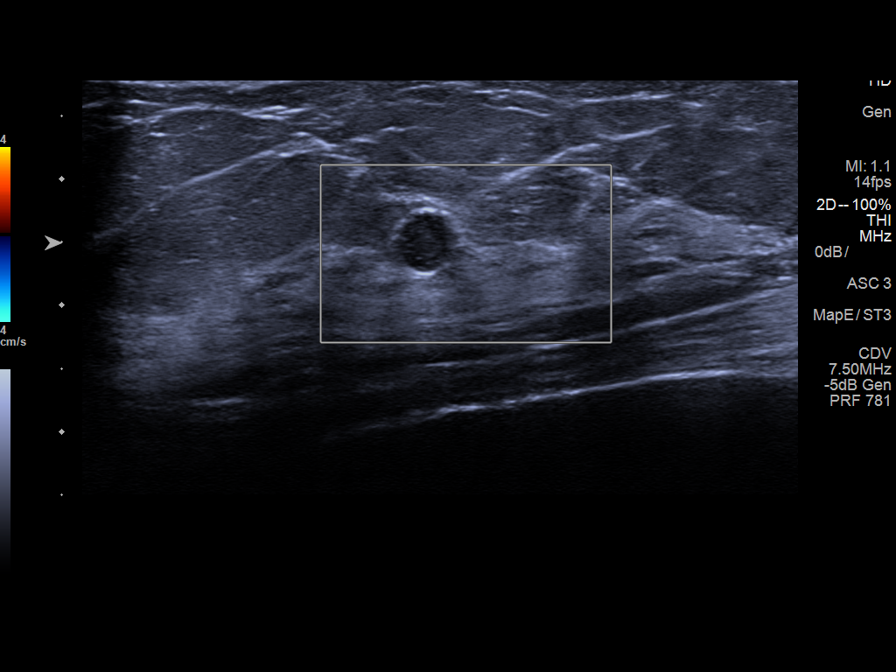
[im 7/8]
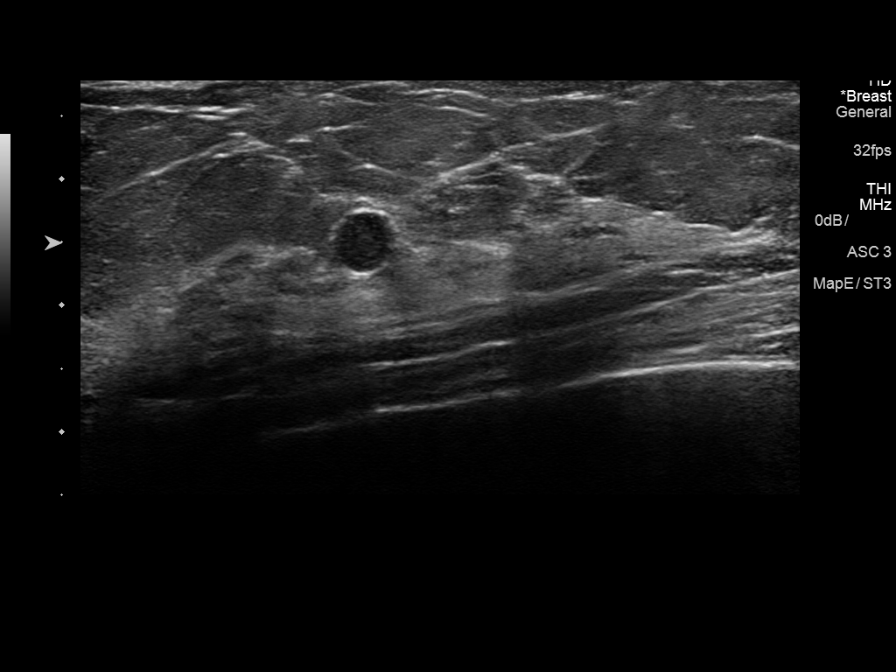
[im 8/8]
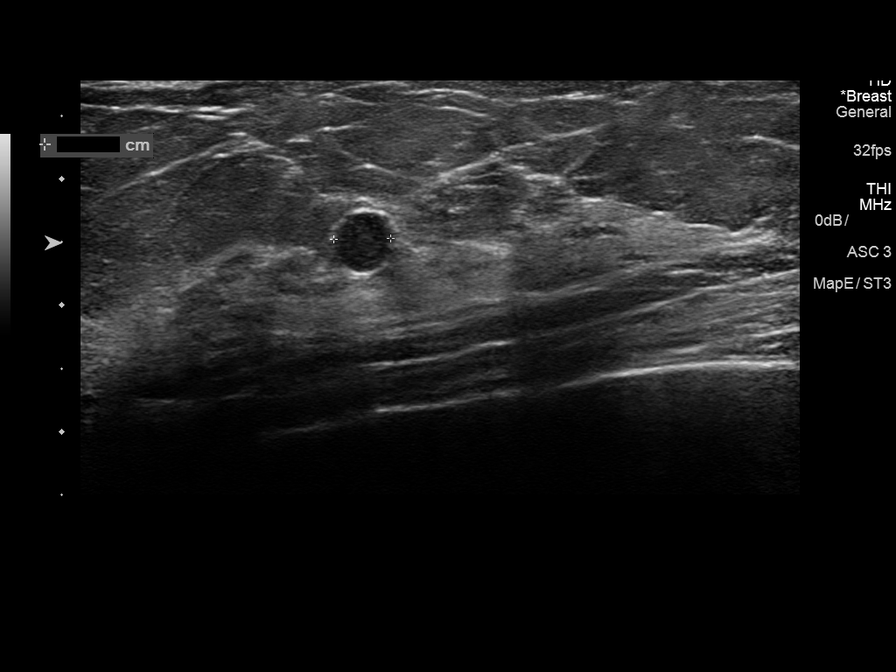

[8 of 8 positions shown; findings below may reference images not displayed]

ACR Breast Density Category c: The breast tissue is heterogeneously
dense, which may obscure small masses.
FINDINGS: Spot compression 2D and 3D tomographic images of the left breast
were obtained. These confirm a small, rounded, circumscribed mass in
the upper outer quadrant.

Mammographic images were processed with CAD.

On physical exam, no mass is palpable in the upper outer left
breast.

Targeted ultrasound is performed, showing a 5 mm rounded,
circumscribed, hypoechoic mass with anechoic portions and scattered
internal echoes in the 2 o'clock position of the left breast, 3 cm
from the nipple. This has increased through transmission of sound
and no internal blood flow with color Doppler.
IMPRESSION: 5 mm mildly complicated cyst in the 2 o'clock position of the left
breast. No evidence of malignancy.

RECOMMENDATION:
Bilateral screening mammogram in 1 year.

I have discussed the findings and recommendations with the patient.
Results were also provided in writing at the conclusion of the
visit. If applicable, a reminder letter will be sent to the patient
regarding the next appointment.

BI-RADS CATEGORY  2: Benign.

## 2017-12-27 ENCOUNTER — Other Ambulatory Visit: Payer: Self-pay | Admitting: "Endocrinology

## 2018-08-27 ENCOUNTER — Telehealth: Payer: Self-pay

## 2018-08-27 ENCOUNTER — Ambulatory Visit: Payer: 59

## 2018-08-27 NOTE — Telephone Encounter (Signed)
Noted  

## 2018-08-27 NOTE — Telephone Encounter (Signed)
PATIENT WAS A NO SHOW AND LETTER SENT  °

## 2020-03-09 ENCOUNTER — Telehealth: Payer: Self-pay | Admitting: Women's Health

## 2020-03-09 NOTE — Telephone Encounter (Signed)

## 2020-03-10 ENCOUNTER — Encounter: Payer: 59 | Admitting: Women's Health

## 2020-08-02 ENCOUNTER — Other Ambulatory Visit (HOSPITAL_COMMUNITY): Payer: Self-pay | Admitting: Internal Medicine

## 2020-08-02 DIAGNOSIS — Z1231 Encounter for screening mammogram for malignant neoplasm of breast: Secondary | ICD-10-CM

## 2020-08-11 ENCOUNTER — Other Ambulatory Visit: Payer: Self-pay

## 2020-08-11 ENCOUNTER — Ambulatory Visit (HOSPITAL_COMMUNITY)
Admission: RE | Admit: 2020-08-11 | Discharge: 2020-08-11 | Disposition: A | Discharge status: Home / Self Care | Payer: 59 | Source: Ambulatory Visit | Attending: Internal Medicine | Admitting: Internal Medicine

## 2020-08-11 DIAGNOSIS — Z1231 Encounter for screening mammogram for malignant neoplasm of breast: Secondary | ICD-10-CM | POA: Diagnosis not present

## 2021-09-16 ENCOUNTER — Other Ambulatory Visit (HOSPITAL_COMMUNITY): Payer: Self-pay | Admitting: Internal Medicine

## 2021-09-16 DIAGNOSIS — Z1231 Encounter for screening mammogram for malignant neoplasm of breast: Secondary | ICD-10-CM

## 2021-09-21 ENCOUNTER — Ambulatory Visit (HOSPITAL_COMMUNITY)
Admission: RE | Admit: 2021-09-21 | Discharge: 2021-09-21 | Disposition: A | Discharge status: Home / Self Care | Payer: 59 | Source: Ambulatory Visit | Attending: Internal Medicine | Admitting: Internal Medicine

## 2021-09-21 ENCOUNTER — Other Ambulatory Visit: Payer: Self-pay

## 2021-09-21 DIAGNOSIS — Z1231 Encounter for screening mammogram for malignant neoplasm of breast: Secondary | ICD-10-CM | POA: Insufficient documentation

## 2022-06-08 DIAGNOSIS — I10 Essential (primary) hypertension: Secondary | ICD-10-CM | POA: Diagnosis not present

## 2022-09-08 DIAGNOSIS — M545 Low back pain, unspecified: Secondary | ICD-10-CM | POA: Diagnosis not present

## 2022-09-08 DIAGNOSIS — R3129 Other microscopic hematuria: Secondary | ICD-10-CM | POA: Diagnosis not present

## 2022-09-08 DIAGNOSIS — Z6824 Body mass index (BMI) 24.0-24.9, adult: Secondary | ICD-10-CM | POA: Diagnosis not present

## 2022-09-08 DIAGNOSIS — M47816 Spondylosis without myelopathy or radiculopathy, lumbar region: Secondary | ICD-10-CM | POA: Diagnosis not present

## 2022-09-08 DIAGNOSIS — F17200 Nicotine dependence, unspecified, uncomplicated: Secondary | ICD-10-CM | POA: Diagnosis not present

## 2022-10-12 ENCOUNTER — Other Ambulatory Visit (HOSPITAL_COMMUNITY): Payer: Self-pay | Admitting: Internal Medicine

## 2022-10-12 DIAGNOSIS — E039 Hypothyroidism, unspecified: Secondary | ICD-10-CM | POA: Diagnosis not present

## 2022-10-12 DIAGNOSIS — R0989 Other specified symptoms and signs involving the circulatory and respiratory systems: Secondary | ICD-10-CM

## 2022-10-12 DIAGNOSIS — Z0001 Encounter for general adult medical examination with abnormal findings: Secondary | ICD-10-CM | POA: Diagnosis not present

## 2022-10-12 DIAGNOSIS — Z6824 Body mass index (BMI) 24.0-24.9, adult: Secondary | ICD-10-CM | POA: Diagnosis not present

## 2022-10-12 DIAGNOSIS — I10 Essential (primary) hypertension: Secondary | ICD-10-CM | POA: Diagnosis not present

## 2022-10-18 ENCOUNTER — Ambulatory Visit (HOSPITAL_COMMUNITY)
Admission: RE | Admit: 2022-10-18 | Discharge: 2022-10-18 | Disposition: A | Discharge status: Home / Self Care | Payer: Medicare Other | Source: Ambulatory Visit | Attending: Internal Medicine | Admitting: Internal Medicine

## 2022-10-18 DIAGNOSIS — R0989 Other specified symptoms and signs involving the circulatory and respiratory systems: Secondary | ICD-10-CM | POA: Insufficient documentation

## 2022-10-18 DIAGNOSIS — I6523 Occlusion and stenosis of bilateral carotid arteries: Secondary | ICD-10-CM | POA: Diagnosis not present

## 2022-10-25 ENCOUNTER — Encounter: Payer: Medicare Other | Admitting: Vascular Surgery

## 2022-11-01 ENCOUNTER — Other Ambulatory Visit: Payer: Self-pay

## 2022-11-01 ENCOUNTER — Encounter: Payer: Self-pay | Admitting: Vascular Surgery

## 2022-11-01 ENCOUNTER — Ambulatory Visit: Payer: Medicare Other | Admitting: Vascular Surgery

## 2022-11-01 VITALS — BP 124/74 | HR 72 | Temp 98.2°F | Ht 64.0 in | Wt 140.6 lb

## 2022-11-01 DIAGNOSIS — I6521 Occlusion and stenosis of right carotid artery: Secondary | ICD-10-CM | POA: Diagnosis not present

## 2022-11-01 NOTE — Progress Notes (Signed)
Vascular and Vein Specialist of [place]  Patient name: Linda Mayer MRN: [id number] DOB: 12/26/1955 Sex: female  REASON FOR CONSULT: Evaluation right carotid stenosis  HPI: Linda Mayer is a 67 y.o. female, who is here today for discussion of recent duplex showing right carotid stenosis.  She was found to have a carotid bruit and underwent subsequent duplex on 10/18/2022.  This was done at [facility].  This suggested a 40 to 69% right internal carotid artery stenosis and no significant stenosis on the left.  She is left-handed.  She specifically denies any prior history of amaurosis fugax, aphasia, TIA or stroke.  Past Medical History:  Diagnosis Date   Anxiety    Complication of anesthesia    Pt states that her BP drops really low after anesthesia   Dizziness    GERD (gastroesophageal reflux disease)    Hypertension    Hypothyroidism     Family History  Problem Relation Age of Onset   Heart disease Brother    Thyroid disease Brother    Hypotension Brother     SOCIAL HISTORY: Social History   Socioeconomic History   Marital status: Married    Spouse name: Not on file   Number of children: Not on file   Years of education: Not on file   Highest education level: Not on file  Occupational History   Not on file  Tobacco Use   Smoking status: Every Day    Packs/day: 1.00    Years: 50.00    Total pack years: 50.00    Types: Cigarettes   Smokeless tobacco: Never  Vaping Use   Vaping Use: Never used  Substance and Sexual Activity   Alcohol use: No   Drug use: No   Sexual activity: Not on file  Other Topics Concern   Not on file  Social History Narrative   Not on file   Social Determinants of Health   Financial Resource Strain: Not on file  Food Insecurity: Not on file  Transportation Needs: Not on file  Physical Activity: Not on file  Stress: Not on file  Social Connections: Not on file  Intimate Partner  Violence: Not on file    No Known Allergies  Current Outpatient Medications  Medication Sig Dispense Refill   ALPRAZolam (XANAX) 0.5 MG tablet Take 0.5 mg by mouth at bedtime as needed.       aspirin EC 81 MG tablet Take 81 mg by mouth daily. Swallow whole.     calcium carbonate (OS-CAL - DOSED IN MG OF ELEMENTAL CALCIUM) 1250 (500 Ca) MG tablet Take 1 tablet (500 mg of elemental calcium total) by mouth 2 (two) times daily with a meal. 60 tablet 2   levothyroxine (SYNTHROID) 88 MCG tablet Take 88 mcg by mouth daily.     lisinopril (PRINIVIL,ZESTRIL) 10 MG tablet Take 10 mg by mouth daily.     rosuvastatin (CRESTOR) 10 MG tablet Take 10 mg by mouth daily.     No current facility-administered medications for this visit.    REVIEW OF SYSTEMS:  [X]  denotes positive finding, [ ]  denotes negative finding Cardiac  Comments:  Chest pain or chest pressure:    Shortness of breath upon exertion:    Short of breath when lying flat:    Irregular heart rhythm:        Vascular    Pain in calf, thigh, or hip brought on by ambulation:    Pain in feet at night  that wakes you up from your sleep:     Blood clot in your veins:    Leg swelling:         Pulmonary    Oxygen at home:    Productive cough:     Wheezing:         Neurologic    Sudden weakness in arms or legs:     Sudden numbness in arms or legs:     Sudden onset of difficulty speaking or slurred speech:    Temporary loss of vision in one eye:     Problems with dizziness:  x       Gastrointestinal    Blood in stool:     Vomited blood:         Genitourinary    Burning when urinating:  x   Blood in urine:        Psychiatric    Major depression:         Hematologic    Bleeding problems:    Problems with blood clotting too easily:        Skin    Rashes or ulcers:        Constitutional    Fever or chills:      PHYSICAL EXAM: Vitals:   11/01/22 1524  BP: 124/74  Pulse: 72  Temp: 98.2 F (36.8 C)  SpO2: 96%   Weight: 140 lb 9.6 oz (63.8 kg)  Height: 5\' 4"  (1.626 m)    GENERAL: The patient is a well-nourished female, in no acute distress. The vital signs are documented above. CARDIOVASCULAR: Soft left carotid bruit.  I do not hear a bruit on the right.  She has 2+ radial pulses bilaterally. PULMONARY: There is good air exchange  MUSCULOSKELETAL: There are no major deformities or cyanosis. NEUROLOGIC: No focal weakness or paresthesias are detected. SKIN: There are no ulcers or rashes noted. PSYCHIATRIC: The patient has a normal affect.  DATA:  I reviewed her carotid duplex with her showing 60 to 79% right carotid stenosis and no significant stenosis on the left  MEDICAL ISSUES: Discussed this with the patient.  She is left-handed and I explained that this would be her dominant hemisphere on the right.  I discussed symptoms of carotid disease.  She knows to report immediately to the emergency room should this occur.  I explained that her current level of stenosis does not put her in the any increased risk for stroke.  I have recommended follow-up duplex in 6 months to rule out progression.  I did explain that her cigarette smoking is her #1 risk factor for progression of disease in the critical importance of smoking cessation.  She has been on 81 mg aspirin per day as well.  We will see her again in 6 months with repeat carotid duplex   Rosetta Posner, MD [facility] Vascular and Vein Specialists of [facility] [phone] Pager [phone]  Note: Portions of this report may have been transcribed using voice recognition software.  Every effort has been made to ensure accuracy; however, inadvertent computerized transcription errors may still be present.

## 2022-11-03 ENCOUNTER — Encounter: Payer: Self-pay | Admitting: *Deleted

## 2022-11-06 ENCOUNTER — Ambulatory Visit: Payer: Medicare Other | Attending: Internal Medicine | Admitting: Internal Medicine

## 2022-11-06 ENCOUNTER — Encounter: Payer: Self-pay | Admitting: Internal Medicine

## 2022-11-06 ENCOUNTER — Ambulatory Visit: Payer: 59 | Admitting: Urology

## 2022-11-06 VITALS — BP 162/86 | HR 73 | Ht 65.0 in | Wt 142.4 lb

## 2022-11-06 DIAGNOSIS — R29898 Other symptoms and signs involving the musculoskeletal system: Secondary | ICD-10-CM | POA: Diagnosis not present

## 2022-11-06 DIAGNOSIS — I951 Orthostatic hypotension: Secondary | ICD-10-CM

## 2022-11-06 DIAGNOSIS — E7849 Other hyperlipidemia: Secondary | ICD-10-CM | POA: Diagnosis not present

## 2022-11-06 DIAGNOSIS — Z136 Encounter for screening for cardiovascular disorders: Secondary | ICD-10-CM | POA: Insufficient documentation

## 2022-11-06 DIAGNOSIS — Z720 Tobacco use: Secondary | ICD-10-CM

## 2022-11-06 DIAGNOSIS — I10 Essential (primary) hypertension: Secondary | ICD-10-CM | POA: Diagnosis not present

## 2022-11-06 DIAGNOSIS — I6521 Occlusion and stenosis of right carotid artery: Secondary | ICD-10-CM | POA: Diagnosis not present

## 2022-11-06 DIAGNOSIS — E785 Hyperlipidemia, unspecified: Secondary | ICD-10-CM | POA: Insufficient documentation

## 2022-11-06 DIAGNOSIS — I6529 Occlusion and stenosis of unspecified carotid artery: Secondary | ICD-10-CM | POA: Insufficient documentation

## 2022-11-06 MED ORDER — ROSUVASTATIN CALCIUM 5 MG PO TABS: 5.0000 mg | ORAL_TABLET | Freq: Every day | ORAL | 3 refills | Status: DC

## 2022-11-06 NOTE — Patient Instructions (Addendum)
Medication Instructions:  Your physician has recommended you make the following change in your medication:  Decrease rosuvastatin to 5 mg daily at bedtime Continue other medications the same  Labwork: none  Testing/Procedures: Your physician has requested that you have a lower extremity arterial exercise duplex w/ABI's. During this test, exercise and ultrasound are used to evaluate arterial blood flow in the legs. Allow one hour for this exam. There are no restrictions or special instructions. Coronary Calcium Score CT  Follow-Up: Your physician recommends that you schedule a follow-up appointment in: 1 year. You will receive a reminder call in the mail in about 10 months reminding you to call and schedule your appointment. If you don't receive this call, please contact our office.  Any Other Special Instructions Will Be Listed Below (If Applicable). You have been referred to Smoking Cessation Program  If you need a refill on your cardiac medications before your next appointment, please call your pharmacy.

## 2022-11-06 NOTE — Progress Notes (Addendum)
Cardiology Office Note  Date: 11/06/2022   ID: Linda Mayer, [place] 1956-06-03, MRN [id number]  PCP:  [facility]  Cardiologist:  None Electrophysiologist:  None   Reason for Office Visit: Dizziness   History of Present Illness: Linda Mayer is a 67 y.o. female known to have R ICA stenosis 87 to 79% (follows with vascular surgery), HTN, hypothyroidism, nicotine abuse was referred to cardiology clinic for evaluation of dizziness.  Patient has dizziness when she stands up from a sitting position but otherwise denied any dizziness with exertion. Denies any chest discomfort, DOE, syncope, palpitations or leg swelling. She however reported having heaviness in her legs when she walks and she pushes through it.  She was started on rosuvastatin 10 mg p.o. at bedtime 2 weeks ago following which she started to have aches all over her body. Smokes 1 pack cigarettes per day and wants to cut down.  Past Medical History:  Diagnosis Date   Anxiety    Complication of anesthesia    Pt states that her BP drops really low after anesthesia   Dizziness    GERD (gastroesophageal reflux disease)    Hypertension    Hypothyroidism     Past Surgical History:  Procedure Laterality Date   ABDOMINAL HYSTERECTOMY     BARTHOLIN CYST MARSUPIALIZATION     Removal   THYROIDECTOMY N/A 05/12/2016   Procedure: THYROIDECTOMY;  Surgeon: Aviva Signs, MD;  Location: [facility];  Service: General;  Laterality: N/A;   VESICOVAGINAL FISTULA CLOSURE W/ TAH      Current Outpatient Medications  Medication Sig Dispense Refill   ALPRAZolam (XANAX) 0.5 MG tablet Take 0.5 mg by mouth at bedtime as needed.       aspirin EC 81 MG tablet Take 81 mg by mouth daily. Swallow whole.     calcium carbonate (OS-CAL - DOSED IN MG OF ELEMENTAL CALCIUM) 1250 (500 Ca) MG tablet Take 1 tablet (500 mg of elemental calcium total) by mouth 2 (two) times daily with a meal. 60 tablet 2   levothyroxine (SYNTHROID) 88  MCG tablet Take 88 mcg by mouth daily.     lisinopril (PRINIVIL,ZESTRIL) 10 MG tablet Take 10 mg by mouth daily.     rosuvastatin (CRESTOR) 10 MG tablet Take 10 mg by mouth daily.     No current facility-administered medications for this visit.   Allergies:  Zocor [simvastatin]   Social History: The patient  reports that she has been smoking cigarettes. She has a 50.00 pack-year smoking history. She has never used smokeless tobacco. She reports that she does not drink alcohol and does not use drugs.   Family History: The patient's family history includes Heart disease in her brother; Hypotension in her brother; Thyroid disease in her brother.   ROS:  Please see the history of present illness. Otherwise, complete review of systems is positive for none.  All other systems are reviewed and negative.   Physical Exam: VS:  Ht 5\' 5"  (1.651 m)   Wt 142 lb 6.4 oz (64.6 kg)   SpO2 94%   BMI 23.70 kg/m , BMI Body mass index is 23.7 kg/m.  Wt Readings from Last 3 Encounters:  11/06/22 142 lb 6.4 oz (64.6 kg)  11/01/22 140 lb 9.6 oz (63.8 kg)  05/03/17 151 lb (68.5 kg)    General: Patient appears comfortable at rest. HEENT: Conjunctiva and lids normal, oropharynx clear with moist mucosa. Neck: Supple, no elevated JVP or carotid bruits, no thyromegaly.  Lungs: Clear to auscultation, nonlabored breathing at rest. Cardiac: Regular rate and rhythm, no S3 or significant systolic murmur, no pericardial rub. Abdomen: Soft, nontender, no hepatomegaly, bowel sounds present, no guarding or rebound. Extremities: No pitting edema, distal pulses 2+. Skin: Warm and dry. Musculoskeletal: No kyphosis. Neuropsychiatric: Alert and oriented x3, affect grossly appropriate.  ECG:  An ECG dated 11/06/2022 was personally reviewed today and demonstrated:  Normal sinus rhythm  Recent Labwork: No results found for requested labs within last 365 days.  No results found for: "CHOL", "TRIG", "HDL", "CHOLHDL", "VLDL",  "LDLCALC", "LDLDIRECT"  Other Studies Reviewed Today:   Assessment and Plan: Patient is a 67 year old F known to have R ICA stenosis 60 to 79% (follows with vascular surgery), HTN, hypothyroidism, nicotine abuse was referred to cardiology clinic for evaluation of dizziness.  # Dizziness likely secondary to seated postural hypotension -Orthostatic vitals today positive for seated postural hypotension (BP drop from supine to sitting position but BP stable after standing). Patient has symptoms of dizziness from sitting to standing position and instructed her to stand up slowly.  # HTN, controlled -Continue lisinopril 10 mg once daily -HTN management per PCP  # HLD -Decrease rosuvastatin from 10 mg to 5 mg nightly due to myalgias. Goal LDL less than 70.  # R ICA stenosis 60 to 79% -Continue aspirin 81 mg once daily -Decrease rosuvastatin 10 mg to 5 mg nightly due to myalgias  # Nicotine abuse -Smoking cessation counseling provided. Smoking cessation instruction/counseling given:  counseled patient on the dangers of tobacco use, advised patient to stop smoking, and reviewed strategies to maximize success. Ambulatory referral to smoking cessation program.  # Screening of CAD -Obtain CT calcium scoring of coronaries  # Bilateral leg weakness, rule out PAD -Obtain ABI with USG arterial Doppler lower extremities  I have spent a total of 45 minutes with patient reviewing chart , telemetry, EKGs, labs and examining patient as well as establishing an assessment and plan that was discussed with the patient.  > 50% of time was spent in direct patient care.     Medication Adjustments/Labs and Tests Ordered: Current medicines are reviewed at length with the patient today.  Concerns regarding medicines are outlined above.   Tests Ordered: Orders Placed This Encounter  Procedures   CT CARDIAC SCORING (SELF PAY ONLY)   Ambulatory referral to Smoking Cessation Program   EKG 12-Lead   VAS [place]  LOWER EXT ART SEG MULTI (SEGMENTALS & LE RAYNAUDS)    Medication Changes: No orders of the defined types were placed in this encounter.   Disposition:  Follow up  1 year  Signed Adylee Leonardo Fidel Levy, MD, 11/06/2022 8:01 AM    [facility], [place], [place] [address]

## 2022-11-07 ENCOUNTER — Other Ambulatory Visit: Payer: Self-pay | Admitting: Internal Medicine

## 2022-11-07 DIAGNOSIS — E7849 Other hyperlipidemia: Secondary | ICD-10-CM

## 2022-11-07 DIAGNOSIS — Z720 Tobacco use: Secondary | ICD-10-CM

## 2022-11-07 DIAGNOSIS — I739 Peripheral vascular disease, unspecified: Secondary | ICD-10-CM

## 2022-11-07 DIAGNOSIS — I6521 Occlusion and stenosis of right carotid artery: Secondary | ICD-10-CM

## 2022-11-07 DIAGNOSIS — R29898 Other symptoms and signs involving the musculoskeletal system: Secondary | ICD-10-CM

## 2022-11-07 DIAGNOSIS — I10 Essential (primary) hypertension: Secondary | ICD-10-CM

## 2022-11-07 DIAGNOSIS — I951 Orthostatic hypotension: Secondary | ICD-10-CM

## 2022-11-07 DIAGNOSIS — Z136 Encounter for screening for cardiovascular disorders: Secondary | ICD-10-CM

## 2022-11-20 ENCOUNTER — Ambulatory Visit: Payer: Medicare Other | Admitting: Urology

## 2022-11-27 ENCOUNTER — Ambulatory Visit: Payer: Medicare Other | Attending: Internal Medicine

## 2022-11-27 DIAGNOSIS — I739 Peripheral vascular disease, unspecified: Secondary | ICD-10-CM

## 2022-11-27 LAB — VAS US LOWER EXT ART SEG MULTI (SEGMENTALS & LE RAYNAUDS)
Left ABI: 1
Right ABI: 1.09

## 2022-12-11 ENCOUNTER — Telehealth: Payer: Self-pay

## 2022-12-11 DIAGNOSIS — Z0000 Encounter for general adult medical examination without abnormal findings: Secondary | ICD-10-CM

## 2022-12-11 NOTE — Addendum Note (Signed)
Addended by: Merlene Laughter on: 12/11/2022 08:05 AM   Modules accepted: Orders

## 2022-12-11 NOTE — Telephone Encounter (Signed)
Called patient per health coaching referral for smoking cessation. Left message for patient to return call to discuss health coaching in detail and determine if she is interested in participating.    Avelino Leeds, MS, ERHD, [facility]  Care Guide, Health & Wellness Coach [address] [place] [address] Telephone: [phone] Email: [email or web address]

## 2022-12-19 DIAGNOSIS — E039 Hypothyroidism, unspecified: Secondary | ICD-10-CM | POA: Diagnosis not present

## 2022-12-19 DIAGNOSIS — Z0001 Encounter for general adult medical examination with abnormal findings: Secondary | ICD-10-CM | POA: Diagnosis not present

## 2022-12-19 DIAGNOSIS — D518 Other vitamin B12 deficiency anemias: Secondary | ICD-10-CM | POA: Diagnosis not present

## 2022-12-19 DIAGNOSIS — E559 Vitamin D deficiency, unspecified: Secondary | ICD-10-CM | POA: Diagnosis not present

## 2022-12-20 ENCOUNTER — Telehealth (HOSPITAL_BASED_OUTPATIENT_CLINIC_OR_DEPARTMENT_OTHER): Payer: Self-pay

## 2022-12-20 DIAGNOSIS — Z0000 Encounter for general adult medical examination without abnormal findings: Secondary | ICD-10-CM

## 2022-12-20 NOTE — Telephone Encounter (Signed)
Called patient to discuss health coaching for smoking cessation. Patient is interested and has been scheduled for her initial session over the phone for 3/22 at [place], MS, ERHD, [facility]  Care Guide, Health & Wellness Coach [address], [address] [place] [address] Telephone: [phone] Email: [email or web address]

## 2022-12-21 ENCOUNTER — Ambulatory Visit
Admission: RE | Admit: 2022-12-21 | Discharge: 2022-12-21 | Disposition: A | Discharge status: Home / Self Care | Payer: Medicare Other | Source: Ambulatory Visit | Attending: Internal Medicine | Admitting: Internal Medicine

## 2022-12-21 ENCOUNTER — Other Ambulatory Visit (HOSPITAL_COMMUNITY): Payer: Self-pay | Admitting: Internal Medicine

## 2022-12-21 DIAGNOSIS — Z136 Encounter for screening for cardiovascular disorders: Secondary | ICD-10-CM | POA: Insufficient documentation

## 2022-12-21 DIAGNOSIS — Z1231 Encounter for screening mammogram for malignant neoplasm of breast: Secondary | ICD-10-CM

## 2022-12-22 ENCOUNTER — Telehealth: Payer: Self-pay

## 2022-12-22 ENCOUNTER — Ambulatory Visit: Payer: Medicare Other

## 2022-12-22 DIAGNOSIS — Z0000 Encounter for general adult medical examination without abnormal findings: Secondary | ICD-10-CM

## 2022-12-22 NOTE — Telephone Encounter (Signed)
Called patient as scheduled for health coaching session. Patient requested to be rescheduled for next week. Patient has been rescheduled for a visit over the phone for 3/27 at 9:00am. Patient will be called at that time.   Avelino Leeds, MS, ERHD, [facility]  Care Guide, Health & Wellness Coach [address]  [place] [address] Telephone: [phone] Email: [email or web address]

## 2022-12-26 ENCOUNTER — Telehealth: Payer: Self-pay | Admitting: *Deleted

## 2022-12-26 NOTE — Telephone Encounter (Signed)
Addendum  ADDENDUM REPORT: 12/25/2022 23:24   EXAM: OVER-READ INTERPRETATION  CT CHEST   The following report is an over-read performed by radiologist Dr. Donald Siva [facility] on 12/25/2022. This over-read does not include interpretation of cardiac or coronary anatomy or pathology. The cardiovascular interpretation by the cardiologist is attached.   COMPARISON:  Chest radiographs dated 08/31/2010   FINDINGS: Noncalcified right middle lobe nodule measuring 1.4 x 1.0 cm on images 15/4 and 16/4 and 1.0 cm in length on coronal image number 54/605. Nearby 3 mm nodule in the right middle lobe more laterally on image number 19/4. 4 mm nodule in the right lower lobe on image number 35/4. No other lung nodules visualized. No pleural fluid. No enlarged lymph nodes. The included portion of the upper abdomen is unremarkable. Minimal thoracic spine degenerative changes.   IMPRESSION: 1.4 x 1.0 x 1.0 cm right middle lobe nodule and 2 additional smaller right lung nodules. The largest nodule has a mean diameter of 1.2 cm on images 15/4 in 16/4. Consider one of the following in 3 months for both low-risk and high-risk individuals: (a) repeat chest CT, (b) follow-up PET-CT, or (c) tissue sampling. This recommendation follows the consensus statement: Guidelines for Management of Incidental Pulmonary Nodules Detected on CT Images: From the Fleischner Society 2017; Radiology 2017; 284:228-243.   These results will be called to the ordering clinician or representative by the Radiologist Assistant, and communication documented in the PACS or [employer].     Electronically Signed   By: Claudie Revering M.D.   On: 12/25/2022 23:24

## 2022-12-27 ENCOUNTER — Ambulatory Visit: Payer: Medicare Other | Attending: Internal Medicine

## 2022-12-27 ENCOUNTER — Ambulatory Visit (HOSPITAL_COMMUNITY)
Admission: RE | Admit: 2022-12-27 | Discharge: 2022-12-27 | Disposition: A | Discharge status: Home / Self Care | Payer: Medicare Other | Source: Ambulatory Visit | Attending: Internal Medicine | Admitting: Internal Medicine

## 2022-12-27 DIAGNOSIS — Z1231 Encounter for screening mammogram for malignant neoplasm of breast: Secondary | ICD-10-CM | POA: Diagnosis not present

## 2022-12-27 DIAGNOSIS — Z0000 Encounter for general adult medical examination without abnormal findings: Secondary | ICD-10-CM

## 2022-12-27 NOTE — Progress Notes (Signed)
Appointment Outcome: Completed, Session #: Initial                         Start time: 9:01am   End time: 9:40am   Total Mins: 39 minutes  AGREEMENTS SECTION   Overall Goal(s): Smoking Cessation Stress Management                                       Agreement/Action Steps:  Smoking cessation Aim to smoke 17 cigarettes per day Practice mini quits No smoking in car Remove ashtray Extend timing of smoking after eating to 5 minutes Drink water when urge to smoke arise Purchase toothpicks to chew Track smoking in notebook   Stress management Implement positive self-talk Practice deep breathing   Progress Notes:  Patient stated that she has smoked cigarettes for approximately 50 years. Patient mentioned that she quit for three months when she was 67 yo. Patient stated that she used nicotine patches for one week and had vivid nightmares. Patient shared once she stopped the patches, she was able to maintain not smoking until she recognized that she was gaining weight. Patient did not like she was eating more and decided to resume smoking to control her appetite.   Patient reported that she smokes about one pack per day and would like to cut down from 20 cigarettes to 17 per day. Patient expressed that her reasoning for wanting to quit is because she has started a wellness journey and learned that due to her smoking puts her at risk for a heart attack and that she is aging.  Patient explained that her smoking was out of habit, a stress coping mechanism, and due to boredom. Patient shared her routine that she smokes after waking up, after eating, and when driving. Patient stated that she smokes less when she is at work or busy. Discussed with patient the idea of practicing mini quits to extend the time between smoking cigarettes to aid in reducing the number of cigarettes she smokes per day. Patient stated that she typically goes 30-40 minutes between smoking, but smoke immediately after  eating. Patient shared that she could work on extending the time of smoking after eating by 5 minutes.  Patient shared that she has been thinking about purchasing toothpicks to put in her pocketbook to use in place of smoking when she has a craving. Patient stated that she wanted to try not smoking while driving and that she will take her ash tray out of the car. Patient also thought about using a small notebook to track her smoking habits of when she smoked, and what triggered her. Patient expressed that this was going to be hard to achieve because she has been smoking for so long.   Discussed with patient the idea of practicing positive self-talk to encourage herself when times are challenging when trying to refrain from smoking. Discussed with patient the practice of deep breathing when she feels overwhelmed/stressed or have the urge to smoke. Patient stated that she does try to talk positive to herself and would try to apply it to reducing smoking over the next two weeks. Patient stated that she hadn't made the connection between deep breathing and smoking and would like to give that a try.    Coaching Outcomes: Patient co-created action steps to work towards smoking cessation and management stress as outlined above.  Reviewed Coaching Agreement and Code of Ethics with Patient during initial session. Provided patient with a hard/electronic copy of the Coaching Agreement and Code of Ethics. Answered any questions the patient had if any regarding the Coaching Agreement and Code of Ethics. Patient verbally agreed to adhere to the Coaching Agreement and to abide by the Code of Ethics.  Mailed patient information that supports her action steps along with the Quit4Life workbook and emailed a copy of her action steps.

## 2023-01-01 ENCOUNTER — Ambulatory Visit (INDEPENDENT_AMBULATORY_CARE_PROVIDER_SITE_OTHER): Payer: Medicare Other | Admitting: Urology

## 2023-01-01 VITALS — BP 149/75 | HR 76

## 2023-01-01 DIAGNOSIS — R3129 Other microscopic hematuria: Secondary | ICD-10-CM

## 2023-01-01 NOTE — Progress Notes (Unsigned)
01/01/2023 3:27 PM   Linda Mayer 1956-08-15 [id number]  Referring provider: [facility] [address] [place],  [place] [address]  No chief complaint on file.   HPI:  New patient-  1) microscopic hematuria-patient with a December 2023 UA which showed blood on the dip.  UA today, April 2024, with 3-10 red blood cells per high-powered field.  She is a smoker. No other exposures.  No gross hematuria.   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 TAHx 2007 - she doesn't recall the VVF.    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   PMH: Past Medical History:  Diagnosis Date   Anxiety    Complication of anesthesia    Pt states that her BP drops really low after anesthesia   Dizziness    GERD (gastroesophageal reflux disease)    Hypertension    Hypothyroidism     Surgical History: Past Surgical History:  Procedure Laterality Date   ABDOMINAL HYSTERECTOMY     BARTHOLIN CYST MARSUPIALIZATION     Removal   THYROIDECTOMY N/A 05/12/2016   Procedure: THYROIDECTOMY;  Surgeon: Aviva Signs, MD;  Location: [facility];  Service: General;  Laterality: N/A;   VESICOVAGINAL FISTULA CLOSURE W/ TAH      Home Medications:  Allergies as of 01/01/2023       Reactions   Zocor [simvastatin]         Medication List        Accurate as of January 01, 2023  3:27 PM. If you have any questions, ask your nurse or doctor.          ALPRAZolam 0.5 MG tablet Commonly known as: XANAX Take 0.5 mg by mouth at bedtime as needed.   aspirin EC 81 MG tablet Take 81 mg by mouth daily. Swallow whole.   calcium carbonate 1250 (500 Ca) MG tablet Commonly known as: OS-CAL - dosed in mg of elemental calcium Take 1 tablet (500 mg of elemental calcium total) by mouth 2 (two) times daily with a meal.   levothyroxine 88 MCG tablet Commonly known as: SYNTHROID Take 88 mcg by mouth daily.   lisinopril 10 MG tablet Commonly known as:  ZESTRIL Take 10 mg by mouth daily.   rosuvastatin 5 MG tablet Commonly known as: CRESTOR Take 1 tablet (5 mg total) by mouth daily.        Allergies:  Allergies  Allergen Reactions   Zocor [Simvastatin]     Family History: Family History  Problem Relation Age of Onset   Heart disease Brother    Thyroid disease Brother    Hypotension Brother     Social History:  reports that she has been smoking cigarettes. She has a 50.00 pack-year smoking history. She has never used smokeless tobacco. She reports that she does not drink alcohol and does not use drugs.   Physical Exam: BP (!) 149/75   Pulse 76   Constitutional:  Alert and oriented, No acute distress. HEENT: [place] AT, moist mucus membranes.  Trachea midline, no masses. Cardiovascular: No clubbing, cyanosis, or edema. Respiratory: Normal respiratory effort, no increased work of breathing. GI: Abdomen is soft, nontender, nondistended, no abdominal masses GU: No CVA tenderness Skin: No rashes, bruises or suspicious lesions. Neurologic: Grossly intact, no focal deficits, moving all 4 extremities. Psychiatric: Normal mood and affect.  Laboratory Data: Lab Results  Component Value Date   WBC 10.4 05/13/2016   HGB 12.7 05/13/2016   HCT 38.0  05/13/2016   MCV 90.0 05/13/2016   PLT 171 05/13/2016    Lab Results  Component Value Date   CREATININE 0.68 05/13/2016     Urinalysis No results found for: "COLORURINE", "APPEARANCEUR", "LABSPEC", "PHURINE", "GLUCOSEU", "HGBUR", "BILIRUBINUR", "KETONESUR", "PROTEINUR", "UROBILINOGEN", "NITRITE", "LEUKOCYTESUR"  No results found for: "LABMICR", "WBCUA", "RBCUA", "LABEPIT", "MUCUS", "BACTERIA"   Assessment & Plan:    1. Microscopic hematuria Disc CT scan and cystoscopy and she will proceed.  - Urinalysis, Routine w reflex microscopic   No follow-ups on file.  Festus Aloe, MD  [facility]  [address] [place],  [address] [phone]

## 2023-01-02 LAB — URINALYSIS, ROUTINE W REFLEX MICROSCOPIC
Bilirubin, UA: NEGATIVE
Glucose, UA: NEGATIVE
Ketones, UA: NEGATIVE
Nitrite, UA: NEGATIVE
Protein,UA: NEGATIVE
Specific Gravity, UA: 1.03 (ref 1.005–1.030)
Urobilinogen, Ur: 0.2 mg/dL (ref 0.2–1.0)
pH, UA: 5 (ref 5.0–7.5)

## 2023-01-02 LAB — MICROSCOPIC EXAMINATION: Epithelial Cells (non renal): >10 /hpf — AB (ref 0–10)

## 2023-01-09 ENCOUNTER — Ambulatory Visit: Payer: Self-pay

## 2023-01-10 ENCOUNTER — Ambulatory Visit: Payer: Medicare Other

## 2023-01-10 DIAGNOSIS — Z0000 Encounter for general adult medical examination without abnormal findings: Secondary | ICD-10-CM

## 2023-01-10 NOTE — Progress Notes (Signed)
Appointment Outcome: Completed, Session #: 1                        Start time: 2:30pm   End time: 2:58pm   Total Mins: 28 minutes  AGREEMENTS SECTION   Overall Goal(s): Smoking Cessation Stress Management                                        Agreement/Action Steps:  Smoking cessation Aim to smoke 17 cigarettes per day Practice mini quits No smoking in car Remove ashtray Extend timing of smoking after eating to 5 minutes Drink water when urge to smoke arise Purchase toothpicks to chew Track smoking in notebook    Stress management Implement positive self-talk Practice deep breathing    Progress Notes:  Patient reported that she starting reducing and tracking her smoking on 01/08/23 (end of second week). Patient stated that she has managed to smoke between 14-18 cigarettes. Patient shared that she smoked 9 so far today. Patient mentioned that tracking the number of cigarettes that she smokes has been helpful in keeping her accountable with the number of cigarettes that she smokes. Patient shared that she was agitated this morning and it triggered her to smoke more than she anticipated. Patient stated that the evenings are the most challenging time to avoid smoking due to stressors. Patient engages in household chores and cooking as distractions from smoking.  Patient was able to talk herself out of smoking more and encouraging an extended time between cigarettes. Patient stated that the longest time frame she has been without smoking was approximately 2 hours. Patient expressed that implementing positive self-talk has been helpful in extending time between smoking cigarettes. Patient mentioned stated that she also asks herself questions such as "Is this the right time," and encourage herself that she can go longer.   Patient reported that she removed the ashtray from her car on Monday and has not smoked in the car since. Patient shared that she has purchased the toothpicks but has not  started using them yet. Patient shared that her actions steps regarding smoking cessation will remain the same over the next two weeks. Patient stated that she would like to start reducing smoking to 15 cigarettes per day, but will keep her target at 17.  Patient explained that she has begun to practice deep breathing this week. Patient shared that deep breathing helps some, but not when she is in the moment. Patient stated that she is not expecting this technique to work immediately but will continue to practice it. Patient stated that she is motivated to make these changes to improve her overall health. Patient shared that she is trying to determine if she is experiencing additional stress from trying not to smoke or just have a high stress level due to other concerns.   Discussed with patient the technique of conduct self-check-ins to identify how she is feeling in a given moment so that she can determine what her next steps will be to aid her in grounding herself again with coping with smoking. When asked to identify a technique or activity that she could engage in as a way to ground herself, she was not able to identify a strategy at this time. Offered to send the patient some information on conducting self-check-ins that provides insight into various techniques she can implement that would help reduce her  stress.    Indicators of Success and Accountability:  Patient stated that she was successful with reducing her smoking by tracking the number of cigarettes smoked in her notebook.   Readiness: Patient is in the action stage of smoking cessation and managing stress.   Strengths and Supports: Patient will be supported by her daughters. Patient is relying on being disciplined and mindful of health behaviors.   Challenges and Barriers: Patient is dealing with a high level of stress at this time, which could be a challenge/barrier to implementing her action steps over the next two weeks.     Coaching Outcomes: Patient was interested in additional ways to manage stress. Suggested conducted self-check-ins to identify thoughts and emotions so she can determine what she needs in the moment to calm down in a healthy manner.   Patient will continue to implement her action steps as outlined above over the next two weeks without revisions to smoking cessation, but to include conducting self-check-ins.  Mailed the patient information on conducting self-check-ins to aid in managing stress.     Attempted: Fulfilled - Patient has purchased the toothpicks. Patient is tracking her smoking in a notebook. Patient is practicing deep breathing and implementing positive self-talk to manage stress.   Partial - Patient started implementing her actions steps towards the end of week two up to today. Patient started smoking about 17 cigarettes per day towards the end of week two. Patient was able to extend time between smoking once she began implementing the action steps last week.

## 2023-01-24 ENCOUNTER — Ambulatory Visit: Payer: Medicare Other

## 2023-01-24 DIAGNOSIS — Z0000 Encounter for general adult medical examination without abnormal findings: Secondary | ICD-10-CM

## 2023-01-24 NOTE — Progress Notes (Signed)
Appointment Outcome: Completed, Session #: 2                       Start time: 2:31pm   End time: 2:56pm Total Mins: 25 minutes  AGREEMENTS SECTION   Overall Goal(s): Smoking Cessation Stress Management                                        Agreement/Action Steps:  Smoking cessation Aim to smoke 17 cigarettes per day Practice mini quits No smoking in car Remove ashtray Extend timing of smoking after eating to 5 minutes Drink water when urge to smoke arise Purchase toothpicks to chew Track smoking in notebook   Stress management Implement positive self-talk Practice deep breathing Conduct self-check-ins   Progress Notes:  Patient reported that she had good and bad days when it came to maintaining smoking 17 cigarettes per day. Patient shared that on some days she was able to maintain smoking 17 cigarettes because she wasn't under a lot of stress. Patient stated that there was a 3-day streak where she was able to maintain smoking between 15-17 cigarettes per day. Patient shared that she has been able to extend her time between smoking cigarettes to 2.5 hours at the most while she was cleaning, watching a tv, or other activities to keep herself busy. Patient explained that on the days she was extremely stressed she smoked between 20-22 cigarettes daily.    Patient mentioned that she has not started using the toothpicks because she forgot to put them in her pocketbook. Patient suggested that she put the toothpicks inside a pack of cigarettes to help her make a choice and extend time between smoking. Patient reported that she has been able to increase the time after she eats before smoking from 5 minutes to 7 minutes 85% of the time. Patient shared that she sits on her hands and talk herself through that time frame. Patient stated that tracking her smoking helps in the same way. Patient stated that if an urge arises to smoke, she looks at her sheet to track the time and encourages herself  to wait an additional hour after her last cigarette.   Patient expressed that she has implemented all her action steps to manage her stress but found when she was able to separate herself from a stressor, she wanted to smoke. Patient stated that her stress level is a 9/10 because of drama. Patient expressed interest in speaking with her PCP about attending therapy. Patient shared that being under the amount of stress that she is experiencing is making it a challenging time to quit smoking.    Patient stated that she wanted to challenge herself by reducing her smoking to 14 cigarettes per day. Patient also stated that she will increase her waiting time before smoking after eating to 10 minutes over the next two weeks. Patient expressed that she doesn't feel that her strength is where she wants it to be to deal with stress and avoid smoking, therefore, she will seek therapy.  Indicators of Success and Accountability:  Patient has maintained keeping herself accountable to reducing her smoking by tracking her smoking in a notebook.   Readiness: Patient is in the action stage of stress management and smoking cessation.   Strengths and Supports: Patient will be seeking the support of a therapist. Patient is relying on an increase in self-awareness of  her smoking pattern and being resourceful.  Challenges and Barriers: Patient is dealing with extreme stress that is a challenge to maintaining her action steps to reduce her smoking.   Coaching Outcomes: Patient will implement the following action steps over the next two weeks as she revised below.   Agreement/Action Steps:  Smoking cessation Aim to smoke 14 cigarettes per day Practice mini quits No smoking in car Extend timing of smoking after eating to 10 minutes Drink water when urge to smoke arise Put toothpicks in cigarette pack Track smoking in notebook   Stress management Implement positive self-talk Practice deep breathing Conduct  self-check-ins Discuss attending therapy with PCP   Attempted: Fulfilled - Patient continues to track smoking in notebook. Patient removed ashtray from car, has extending smoking time after eating to 7 minutes, and drink water to deal with cravings. Patient is implementing all action steps towards stress management.  Partial - Patient was able to maintain smoking 17 cigarettes per day approximately 50% of the time. Patient was able to avoid smoking in the car 90% of the time  Not met - Patient has not used the toothpicks over the past two weeks.

## 2023-01-29 DIAGNOSIS — Z6823 Body mass index (BMI) 23.0-23.9, adult: Secondary | ICD-10-CM | POA: Diagnosis not present

## 2023-01-29 DIAGNOSIS — R0989 Other specified symptoms and signs involving the circulatory and respiratory systems: Secondary | ICD-10-CM | POA: Diagnosis not present

## 2023-01-29 DIAGNOSIS — F5101 Primary insomnia: Secondary | ICD-10-CM | POA: Diagnosis not present

## 2023-01-29 DIAGNOSIS — R9389 Abnormal findings on diagnostic imaging of other specified body structures: Secondary | ICD-10-CM | POA: Diagnosis not present

## 2023-01-29 DIAGNOSIS — R918 Other nonspecific abnormal finding of lung field: Secondary | ICD-10-CM | POA: Diagnosis not present

## 2023-01-29 DIAGNOSIS — I10 Essential (primary) hypertension: Secondary | ICD-10-CM | POA: Diagnosis not present

## 2023-02-07 ENCOUNTER — Ambulatory Visit: Payer: Medicare Other | Attending: Cardiology

## 2023-02-07 DIAGNOSIS — Z0000 Encounter for general adult medical examination without abnormal findings: Secondary | ICD-10-CM

## 2023-02-07 NOTE — Progress Notes (Signed)
Appointment Outcome: Completed, Session #: 3                         Start time: 2:30pm  End time: 2:55pm   Total Mins: 25 minutes  AGREEMENTS SECTION   Overall Goal(s): Smoking Cessation Stress Management        Agreement/Action Steps:  Smoking cessation Aim to smoke 14 cigarettes per day Practice mini quits No smoking in car Extend timing of smoking after eating to 10 minutes Drink water when urge to smoke arise Put toothpicks in cigarette pack Track smoking in notebook   Stress management Implement positive self-talk Practice deep breathing Conduct self-check-ins Discuss attending therapy with PCP    Progress Notes:  Patient reported that she was able to reduce her smoking most days to 14 cigarettes or less. Patient stated that five days she smoked 10 cigarettes each day, and on two days she smoked 16 cigarettes. Patient stated that on the days that she smoked more, she gave into the urge to smoke (e.g., after work). Patient shared there are times when she is irritable due to withdrawal.   Patient stated that on the days that she smoked 10 cigarettes, she kept herself occupied by walking her dog outside, walking around in the house, and watching tv. Patient stated that when it became really challenging to prolong her smoking another cigarette, she would implement positive self-talk telling herself that she can do this or looking at her notebook to remind herself of the last time she smoked.   Patient stated that she has been able to extend her time between smoking to 3 hours. Patient shared that she has been able to wait at least 10 minutes after eating each time by sitting on her hands during that time. Patient expressed that she is proud of herself for being able to make this type of progress towards smoking cessation. Patient stated that implementing these steps has helped build her confidence in her ability to quit. Discussed with patient if she has considered setting a quit  date. Patient expressed that she would like to be smoke free within the next 6 months.   Patient mentioned that she spoke with her PCP for about 30 minutes to discuss stress and possibly attending therapy. Patient stated that they decided to try a mild medication instead of therapy. Patient stated that the medication has been working well. Patient takes the medication at bedtime and stated that she can rest at night and able to focus in the morning. Patient is hoping that since her stress level has reduced to a 3.5/10 that it will be helpful in reducing her smoking more over time.   Patient stated that she continues to practice deep breathing to calm herself when she becomes overwhelmed. Patient shared an incident where she utilized these strategies to keep her from smoking. Patient stated that when she conducts self-check-ins, the strategy helps her think of other things she can do to calm down other than smoke. Discussed with patient how she can incorporate deep breathing as a strategy to cope with withdrawal symptoms and urges to smoke.   Indicators of Success and Accountability:  Patient stated that writing down when she smokes has helped her reduce her smoking by increasing her self-awareness and accountability.   Readiness: Patient is in the action stage of stress management and smoking cessation.   Strengths and Supports: Patient stated that she talks with her sister-n-law for support. Patient is relying on  being strategic, the increase in her self-awareness of her smoking behaviors, and holding herself accountable.   Challenges and Barriers: Patient stated that the withdrawal symptoms and urges to smoke are her challenges at the time.    Coaching Outcomes: Patient stated that she will aim to smoke 8-10 cigarettes over the next two weeks. Patient shared that she is going to take 10 cigarettes out of a pack for the day and will not smoke after she finish those. Patient will put away the  remaining 10 cigarettes for the next day.   Patient will continue to take medication that her PCP prescribed to help her manage stress and feeling anxious.   Patient is interested in trying deep breathing as a strategy to cope with withdrawal from smoking less and to manage urges to smoke.   Patient will continue to implement all other action steps as outlined above in addition to those discussed today.  Patient stated that she wants to be smoke free in 6 months (November 2024).   Overall Goal(s): Smoking Cessation (November 2024) Stress Management   Agreement/Action Steps:  Smoking cessation Aim to smoke 8-10 cigarettes per day Take out 10 cigarettes per day and put remaining 10 in pack away Practice mini quits No smoking in car Extend timing of smoking after eating to 10 minutes Drink water when urge to smoke arise Practice deep breathing to cope with withdrawal and urges to smoke Track smoking in notebook   Stress management Implement positive self-talk Practice deep breathing Conduct self-check-ins Take medication as prescribed by PCP   Attempted: Fulfilled - Patient has extended her time to 3 hours when practicing mini quits. Patient continues to track her smoking behavior in her notebook. Patient contacted PCP about therapy. Patient continued to implement positive self-talk, practice deep breathing, and conduct self-check-ins to manage her stress.   Partial - Patient was able to refrain from smoking in the car except one time after work. Patient was able to meet smoking 14 cigarettes or less 12 days out of 14 days.  Not attempted: Dropped/Revised - Patient determined that putting toothpicks in a cigarette pack is not a strategy she wants to implement.

## 2023-02-16 ENCOUNTER — Ambulatory Visit (INDEPENDENT_AMBULATORY_CARE_PROVIDER_SITE_OTHER): Payer: Medicare Other | Admitting: Pulmonary Disease

## 2023-02-16 ENCOUNTER — Encounter: Payer: Self-pay | Admitting: Pulmonary Disease

## 2023-02-16 VITALS — BP 128/72 | HR 81 | Ht 65.0 in | Wt 141.2 lb

## 2023-02-16 DIAGNOSIS — R911 Solitary pulmonary nodule: Secondary | ICD-10-CM | POA: Diagnosis not present

## 2023-02-16 DIAGNOSIS — F17210 Nicotine dependence, cigarettes, uncomplicated: Secondary | ICD-10-CM

## 2023-02-16 NOTE — Patient Instructions (Signed)
It is nice to meet you  There is a nodule about 1.4 cm (a little over half an inch) in the right middle lobe of your lung.  Nodules represent many things, inflammation, dust, allergens, pollens, infections etc.  We worry about most with nodules of something like cancer.  If it is cancer, the most likely kinds of cancer or from tissue in the lung, lung cancer itself, and another type of cancer called carcinoid.  Given the size is greater than 1 cm, I think we should repeat a CT scan as soon as possible.  If the nodules are shrinking or getting smaller, then nothing further needed.  If the nodule is the same size or bigger, we need to move forward with a biopsy.  I will help arrange this depending on the results of the CT scan.  Just in case it is cancer and we need to do surgery in the future, I have ordered pulmonary function test to help [place] decide if surgery is an option or not.  My hope is repeat the scan and the nodules are gone or smaller and we do not have anything to worry about.  By I want to plan for the worse just in case.  Return to clinic in 2 months or sooner as needed with Dr. Judeth Horn

## 2023-02-16 NOTE — Progress Notes (Signed)
@Patient  ID: Linda Mayer, female    DOB: [birth year], 67 y.o.   MRN: [id number]  Chief Complaint  Patient presents with   Consult    Referred by PCP for abnormal CT scan back in March 2024 that showed nodules.     Referring provider: Elfredia Nevins, MD  HPI:   67 y.o. woman whom we are seeing for evaluation of lung nodules.  Most recent PCP note reviewed.  Patient was in usual state of health.  Offered a CT coronary scan for healthcare maintenance.  Denies significant dyspnea.  This revealed some coronary calcification.  But on my review interpretation mostly notable for scattered nodules largest of which is 1.4 cm in the right middle lobe.  Rounded appearing.  She is currently smoking.  Working on cutting back.  Denies any dyspnea.  Minimal cough but sometimes a problem.  Usually in the mornings and evenings.  Throughout the day not much of a big deal.  We discussed at length the etiology of nodules.  As a pertains to her.  Whenever Mayo calculator risk.  Discussed roles including surveillance, biopsy, PET scan.  Recommended repeat CT scan with super D for possible navigational bronchoscopy, no lymphadenopathy seen on prior CT scan.  Discussed PET scan but given possible false negative and need for repeat super D CT if PET avid, decided to straight towards super D and likely biopsy if no smaller.  If smaller can do surveillance.  If same size or enlarging will pursue biopsy.  PMH: Tobacco abuse, seasonal allergies, hyperlipidemia, hypertension Surgical history: Reviewed with patient, she denies any Family history: First-degree relatives with allergies, no significant respiratory disease Social history: Current smoker, down to close to half a pack, 50+ pack years, lives in [place] / Pulmonary Flowsheets:   ACT:      No data to display          MMRC:     No data to display          Epworth:      No data to display          Tests:   FENO:  No  results found for: "NITRICOXIDE"  PFT:     No data to display          WALK:      No data to display          Imaging: Personally reviewed and as per EMR discussion in this note No results found.  Lab Results: Personally reviewed and as per EMR CBC    Component Value Date/Time   WBC 10.4 05/13/2016 0541   RBC 4.22 05/13/2016 0541   HGB 12.7 05/13/2016 0541   HCT 38.0 05/13/2016 0541   PLT 171 05/13/2016 0541   MCV 90.0 05/13/2016 0541   MCH 30.1 05/13/2016 0541   MCHC 33.4 05/13/2016 0541   RDW 13.1 05/13/2016 0541   LYMPHSABS 2.8 05/08/2016 0920   MONOABS 0.4 05/08/2016 0920   EOSABS 0.1 05/08/2016 0920   BASOSABS 0.0 05/08/2016 0920    BMET    Component Value Date/Time   NA 139 05/13/2016 0541   K 3.9 05/13/2016 0541   CL 107 05/13/2016 0541   CO2 27 05/13/2016 0541   GLUCOSE 106 (H) 05/13/2016 0541   BUN 16 05/13/2016 0541   CREATININE 0.68 05/13/2016 0541   CALCIUM 8.9 05/13/2016 0541   GFRNONAA >60 05/13/2016 0541   GFRAA >60 05/13/2016 0541    BNP No results found  for: "BNP"  ProBNP No results found for: "PROBNP"  Specialty Problems   None   Allergies  Allergen Reactions   Zocor [Simvastatin]      There is no immunization history on file for this patient.  Past Medical History:  Diagnosis Date   Anxiety    Complication of anesthesia    Pt states that her BP drops really low after anesthesia   Dizziness    GERD (gastroesophageal reflux disease)    Hypertension    Hypothyroidism     Tobacco History: Social History   Tobacco Use  Smoking Status Every Day   Packs/day: 1.00   Years: 50.00   Additional pack years: 0.00   Total pack years: 50.00   Types: Cigarettes  Smokeless Tobacco Never  Tobacco Comments   Currently at 10 cigarettes per day.    Ready to quit: Not Answered Counseling given: Not Answered Tobacco comments: Currently at 10 cigarettes per day.    Continue to not smoke  Outpatient Encounter  Medications as of 02/16/2023  Medication Sig   ALPRAZolam (XANAX) 0.5 MG tablet Take 0.5 mg by mouth at bedtime as needed.     aspirin EC 81 MG tablet Take 81 mg by mouth daily. Swallow whole.   calcium carbonate (OS-CAL - DOSED IN MG OF ELEMENTAL CALCIUM) 1250 (500 Ca) MG tablet Take 1 tablet (500 mg of elemental calcium total) by mouth 2 (two) times daily with a meal.   levothyroxine (SYNTHROID) 88 MCG tablet Take 88 mcg by mouth daily.   lisinopril (PRINIVIL,ZESTRIL) 10 MG tablet Take 10 mg by mouth daily.   mirtazapine (REMERON) 15 MG tablet Take 15 mg by mouth at bedtime.   Vitamin D, Ergocalciferol, (DRISDOL) 1.25 MG (50000 UNIT) CAPS capsule Take 50,000 Units by mouth once a week.   [DISCONTINUED] rosuvastatin (CRESTOR) 5 MG tablet Take 1 tablet (5 mg total) by mouth daily.   No facility-administered encounter medications on file as of 02/16/2023.     Review of Systems  Review of Systems  No chest pain with exertion.  No orthopnea or PND.  Comprehensive review of systems otherwise negative. Physical Exam  BP 128/72   Pulse 81   Ht 5\' 5"  (1.651 m)   Wt 141 lb 3.2 oz (64 kg)   SpO2 99%   BMI 23.50 kg/m   Wt Readings from Last 5 Encounters:  02/16/23 141 lb 3.2 oz (64 kg)  11/06/22 142 lb 6.4 oz (64.6 kg)  11/01/22 140 lb 9.6 oz (63.8 kg)  05/03/17 151 lb (68.5 kg)  05/12/16 160 lb (72.6 kg)    BMI Readings from Last 5 Encounters:  02/16/23 23.50 kg/m  11/06/22 23.70 kg/m  11/01/22 24.13 kg/m  05/03/17 25.92 kg/m  05/12/16 26.63 kg/m     Physical Exam General: Sitting in chair, no acute distress Eyes: EOMI, no icterus Neck: Supple, no JVP Pulmonary: Distant, clear Cardiovascular: Warm, no edema Abdomen: Nondistended, bowel sounds present MSK: No synovitis, no joint effusion Neuro: Normal gait, no weakness Psych: Normal mood, full affect   Assessment & Plan:   Lung nodules: Multiple, largest 1.4 cm right middle lobe.  Rounded appearing.  Approximate  15% risk of malignancy via Mayo calculator.  Given appearance do wonder about carcinoid in addition to primary lung cancer.  Given size and history of smoking, recommend repeat CT scan super D and the same size or enlarging straight to biopsy.  Discussed PET scan as option but with carcinoid on the differential could be  a false negative, and if it is positive we would need a super D and a biopsy anyway.  Anticipation of possible surgical resection, PFTs ordered to evaluate surgical candidacy.  Tobacco abuse:Smoking assessment and cessation counseling I have advised the patient to quit/stop smoking as soon as possible due to high risk for multiple medical problems.  It will also be very difficult for [place] to manage patient's  respiratory symptoms and status if we continue to expose her lungs to a known irritant.  We do not advise e-cigarettes as a form of stopping smoking. Patient is willing to quit smoking. I have advised the patient that we can assist and have options of nicotine replacement therapy, provided smoking cessation education today, provided smoking cessation counseling, and provided cessation resources. Follow-up next office visit office visit for assessment of smoking cessation.  I spent 4 minutes in smoking cessation counseling.     Return in about 2 months (around 04/18/2023) for f/u Dr. Judeth Horn.   Karren Burly, MD 02/16/2023

## 2023-02-21 ENCOUNTER — Ambulatory Visit (HOSPITAL_BASED_OUTPATIENT_CLINIC_OR_DEPARTMENT_OTHER)
Admission: RE | Admit: 2023-02-21 | Discharge: 2023-02-21 | Disposition: A | Discharge status: Home / Self Care | Payer: Medicare Other | Source: Ambulatory Visit | Attending: Pulmonary Disease | Admitting: Pulmonary Disease

## 2023-02-21 ENCOUNTER — Ambulatory Visit (INDEPENDENT_AMBULATORY_CARE_PROVIDER_SITE_OTHER): Payer: Medicare Other

## 2023-02-21 ENCOUNTER — Encounter (HOSPITAL_BASED_OUTPATIENT_CLINIC_OR_DEPARTMENT_OTHER): Payer: Self-pay

## 2023-02-21 DIAGNOSIS — I2510 Atherosclerotic heart disease of native coronary artery without angina pectoris: Secondary | ICD-10-CM | POA: Diagnosis not present

## 2023-02-21 DIAGNOSIS — R918 Other nonspecific abnormal finding of lung field: Secondary | ICD-10-CM | POA: Insufficient documentation

## 2023-02-21 DIAGNOSIS — I700 Atherosclerosis of aorta: Secondary | ICD-10-CM | POA: Insufficient documentation

## 2023-02-21 DIAGNOSIS — Z0000 Encounter for general adult medical examination without abnormal findings: Secondary | ICD-10-CM

## 2023-02-21 DIAGNOSIS — R911 Solitary pulmonary nodule: Secondary | ICD-10-CM | POA: Diagnosis not present

## 2023-02-21 NOTE — Progress Notes (Unsigned)
Appointment Outcome: Completed, Session #: 4                        Start time: 2:34pm   End time: 3:01pm   Total Mins: 27 minutes  AGREEMENTS SECTION   Overall Goal(s): Smoking Cessation (November 2024) Stress Management    Agreement/Action Steps:  Smoking cessation Aim to smoke 8-10 cigarettes per day Take out 10 cigarettes per day and put remaining 10 in pack away Practice mini quits No smoking in car Extend timing of smoking after eating to 10 minutes Drink water when urge to smoke arise Practice deep breathing to cope with withdrawal and urges to smoke Track smoking in notebook   Stress management Implement positive self-talk Practice deep breathing Conduct self-check-ins Take medication as prescribed by PCP    Progress Notes:  Patient mentioned that she was able to maintain smoking 10 cigarettes per day by taking them out the pack and putting the other 10 away. Patient shared that she challenged herself today to smoke 5 cigarettes. Patient stated that she has smoked 4 cigarettes so far and will wait until later to smoke the last cigarette. Patient stated that she is implementing positive self-talk to encourage herself through challenging times when the urges are strong.  Patient stated that she also talks to her friend about her smoking progress and mood changes. Patient mentioned that she has had fluctuations in her emotions that includes crying, and she doesn't know why. Patient stated that the medication, Remeron, has been working for her to keep her calmer when she gets stressed or upset.   Patient stated that previously she would say what she thought during a stressful moment and then smoke. Patient stated that she is conducting self-check-ins during these moments to give herself a chance to think before reacting and not relying on smoking a cigarette to calm down.   Patient stated that she put chewing gum and toothpicks in an empty cigarette pack that she takes with  her. Patient mentioned that she has been able to extend her time between smoking by chewing gum when she is driving. Patient stated that the longest time frame that she can go without smoking is 4-5 hours.   Patient stated that sometimes when she has the urge to smoke, she sips soda through a straw instead. Patient mentioned that she is finding herself eating more. Patient shared that she bought chocolate chip cookies to eat when she knows she should be eating fruit or something else healthy. Patient stated that she drinks a lot of water, either plain or with a flavor pack. Patient has not noticed if drinking water helps with deterring her from smoking.  Patient expressed that improving her health is a motivator to keep cutting back. Patient shared that if she needs a particular procedure, she has been told that they would not perform it if she was still smoking. Patient stated that this was a serious warning.     Indicators of Success and Accountability:  Patient has been able to reduce her smoking to less than 10 cigarettes per day over the past two weeks.    Readiness: Patient is in the action stage of smoking cessation and stress management.    Strengths and Supports: Patient is being supported by a friend. Patient is relying on her determination and motivation to quit smoking and manage stress.  Challenges and Barriers: Patient is experiencing mood changes, which may be a challenge to smoking cessation.  Coaching Outcomes: Patient will implement the following action steps over the next two weeks as outlined below.  Agreement/Action Steps:  Smoking cessation Aim to smoke 5 cigarettes per day Take out 5 cigarettes per day and put remaining cigarettes away Practice mini quits No smoking in car Extend timing of smoking after eating to 10 minutes Drink water when urge to smoke arise Chew gum or toothpicks Sip through straws Practice deep breathing to cope with withdrawal and urges to  smoke Track smoking in notebook   Stress management Implement positive self-talk Practice deep breathing Conduct self-check-ins Take medication as prescribed by PCP    Attempted: Fulfilled - Patient completed the bi-weekly agreement in full and was able to meet the challenge.

## 2023-02-26 NOTE — Progress Notes (Signed)
History of smoking.  Dominant right middle lobe nodule, approximately 1.5 cm.  Present for couple months (at least).  Very rounded, query carcinoid.  No lymphadenopathy.  Would you consider a navigational bronchoscopy? Thanks!

## 2023-02-27 ENCOUNTER — Other Ambulatory Visit: Payer: Self-pay | Admitting: Emergency Medicine

## 2023-02-27 DIAGNOSIS — R911 Solitary pulmonary nodule: Secondary | ICD-10-CM

## 2023-02-28 ENCOUNTER — Encounter: Payer: Self-pay | Admitting: Emergency Medicine

## 2023-02-28 ENCOUNTER — Other Ambulatory Visit: Payer: Self-pay

## 2023-02-28 DIAGNOSIS — Z01812 Encounter for preprocedural laboratory examination: Secondary | ICD-10-CM

## 2023-03-01 ENCOUNTER — Ambulatory Visit (HOSPITAL_COMMUNITY): Payer: Medicare Other

## 2023-03-01 ENCOUNTER — Ambulatory Visit (HOSPITAL_COMMUNITY)
Admission: RE | Admit: 2023-03-01 | Discharge: 2023-03-01 | Disposition: A | Discharge status: Home / Self Care | Payer: Medicare Other | Source: Ambulatory Visit | Attending: Urology | Admitting: Urology

## 2023-03-01 DIAGNOSIS — R3129 Other microscopic hematuria: Secondary | ICD-10-CM | POA: Diagnosis not present

## 2023-03-01 MED ORDER — SODIUM CHLORIDE (PF) 0.9 % IJ SOLN
INTRAMUSCULAR | Status: AC
  Filled 2023-03-01: qty 50

## 2023-03-01 MED ORDER — SODIUM CHLORIDE 0.9 % IV SOLN
INTRAVENOUS | Status: AC
  Filled 2023-03-01: qty 250

## 2023-03-01 MED ORDER — IOHEXOL 300 MG/ML  SOLN
100.0000 mL | Freq: Once | INTRAMUSCULAR | Status: AC | PRN
  Administered 2023-03-01: 100 mL via INTRAVENOUS

## 2023-03-05 ENCOUNTER — Encounter: Payer: Self-pay | Admitting: Urology

## 2023-03-05 ENCOUNTER — Ambulatory Visit: Payer: Medicare Other | Admitting: Urology

## 2023-03-05 VITALS — BP 107/61 | HR 69

## 2023-03-05 DIAGNOSIS — R3129 Other microscopic hematuria: Secondary | ICD-10-CM | POA: Diagnosis not present

## 2023-03-05 LAB — URINALYSIS, ROUTINE W REFLEX MICROSCOPIC
Bilirubin, UA: NEGATIVE
Glucose, UA: NEGATIVE
Ketones, UA: NEGATIVE
Nitrite, UA: NEGATIVE
Protein,UA: NEGATIVE
Specific Gravity, UA: 1.025 (ref 1.005–1.030)
Urobilinogen, Ur: 0.2 mg/dL (ref 0.2–1.0)
pH, UA: 5.5 (ref 5.0–7.5)

## 2023-03-05 LAB — MICROSCOPIC EXAMINATION

## 2023-03-05 MED ORDER — CIPROFLOXACIN HCL 500 MG PO TABS: 500.0000 mg | ORAL_TABLET | Freq: Two times a day (BID) | ORAL | 0 refills | Status: DC

## 2023-03-05 NOTE — Progress Notes (Unsigned)
  [place]   03/05/23  CC:  Chief Complaint  Patient presents with   Follow-up    CT results    HPI: F/u -   1) microscopic hematuria-patient with a December 2023 UA which showed blood on the dip.  UA today, April 2024, with 3-10 red blood cells per high-powered field.  She is a smoker. No other exposures. No gross hematuria.    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 TAHx 2007 - she doesn't recall the VVF.    Today, seen for the above. CT benign May 2024 - disc with pt unofficial read. She is well without complaints.    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   Blood pressure 107/61, pulse 69. NED. A&Ox3.   No respiratory distress   Abd soft, NT, ND Normal external genitalia with patent urethral meatus Urethra and bladder palpably normal without mass  Chaperone - Arlene for exam and cysto  Cystoscopy Procedure Note  Patient identification was confirmed, informed consent was obtained, and patient was prepped using Betadine solution.  Lidocaine jelly was administered per urethral meatus.    Procedure: - Flexible cystoscope introduced, without any difficulty.   - Thorough search of the bladder revealed:    normal urethral meatus    normal urothelium    no stones    no ulcers     no tumors    no urethral polyps    no trabeculation - small bulge at top of bladder from external process. Noted on CT. Mucosa smooth and benign appearing.   - Ureteral orifices were normal in position and appearance.  Post-Procedure: - Patient tolerated the procedure well  Assessment/ Plan:  Microhematuria - benign eval , but will also review CT read when complete.    No follow-ups on file.  Jerilee Field, MD

## 2023-03-07 ENCOUNTER — Ambulatory Visit: Payer: Medicare Other | Attending: Cardiovascular Disease

## 2023-03-07 ENCOUNTER — Telehealth: Payer: Self-pay

## 2023-03-07 DIAGNOSIS — Z0000 Encounter for general adult medical examination without abnormal findings: Secondary | ICD-10-CM

## 2023-03-07 NOTE — Telephone Encounter (Signed)
Tried calling patient with no answer left voice message for return call.

## 2023-03-07 NOTE — Progress Notes (Unsigned)
Appointment Outcome: Completed, Session #: 5 Start time: 2:35pm   End time: 3:05pm   Total Mins: 30 minutes  AGREEMENTS SECTION   Overall Goal(s): Smoking Cessation (November 2024) Stress Management                                               Agreement/Action Steps:  Smoking cessation Aim to smoke 5 cigarettes per day Take out 5 cigarettes per day and put remaining cigarettes away Practice mini quits No smoking in car Extend timing of smoking after eating to 10 minutes Drink water when urge to smoke arise Chew gum or toothpicks Sip through straws Practice deep breathing to cope with withdrawal and urges to smoke Track smoking in notebook   Stress management Implement positive self-talk Practice deep breathing Conduct self-check-ins Take medication as prescribed by PCP    Progress Notes:  Patient reported that she was able to maintain smoking 5 cigarettes per day by implementing the following schedule and continuing to take her medication as prescribed by her provider:  1 cigarette - after waking up 1 cigarette - after breakfast 1 cigarette - getting home from work 1 cigarette - after supper 1 cigarette - before bed  Patient shared that she was able to go 10.5 hours a few days without smoking a cigarette. Patient stated that during that time frame she worked 5 hours and when she was home, she tried not to think about smoking. Patient stated that when the thought to smoke crossed her mind, she would strike her lighter, but not light a cigarette because she did not have any on her. Patient stated that doing something with her hands during that time helped to take the edge off the craving to smoke.   Patient stated that on March 03, 2023 she experienced a high level of stress and for three days she smoked 10 cigarettes per day. Patient mentioned that this particular day she smoked in the car because of the location she was at when triggered. Patient had left her cigarettes at  home and stopped at a store after encountering the stressful event. Patient stated that prior to the stressful incident, she was implementing positive self-talk to manage stress and deter smoking along with deep breathing, and conducting check-ins. Patient stated that those strategies were not as effective during the stressful period.   Patient expressed that since those three days have passed, she has worked on reducing her smoking again. Patient reported that she is currently smoking approximately 7 cigarettes per day. Patient stated that to help herself get back on track, she has set healthy boundaries with some individuals that will help her to manage her exposure to stressful situations and trigger her smoking. Patient stated that she has to prioritize her goals and health, but it makes her feel guilty that she is having to do so. Patient shared that continuing to drink water, sip through a straw, and keep something in her hands are strategies that is helping her refrain from smoking.   Patient stated that she times herself for 10 minutes after she eats supper before she smokes a cigarette. Patient shared that she continues to drink water, which helps her practice mini quits. Patient stated that she is not chewing gum or using toothpicks at this time, but continues to sip through a straw to deter herself from smoking. Patient explained that  she is mentally tracking her smoking now that she is on a smoking schedule. Patient expressed that since she is smoking less, her sense of smell is coming back and she can smell other smokers.     Indicators of Success and Accountability:  Patient has been able to practice mini quits for up to 10.5 hours on a few occasions and maintained smoking 5 cigarettes per day for 11/14 days.  Readiness: Patient is in the action stage of smoking cessation and stress management.  Strengths and Supports: Patient is being supported by family and neighbors. Patient is relying  on being creative and being dedicated to achieving her goals.   Challenges and Barriers: Patient is working on setting and enforcing healthy boundaries that will minimize her exposure to stressful encounters, but may find it challenging to adjust to this new strategy she chose.   Coaching Outcomes: Patient stated that she is aiming to reduce her smoking back to 5 cigarettes per day over the next two weeks and the follow weeks after than she will aim to smoke 3 cigarettes per day.   Patient stated that she will aim to refrain from smoking 10.5 - 12 hours per day on some occasions.   Patient will continue to set and enforce healthy boundaries to minimize her stress and triggers to smoke.  Patient shared that she has talked to her neighbors and they are supportive of her quitting smoking. Patient stated that she also paints and garden for hobbies which will be helpful distraction to deter smoking.   Patient mentioned that she will set a timer for 15-minutes after dinner to extend the time before smoking.   Patient will incorporate a support system that includes her neighbors.   Patient will continue to implement all other action steps outlined above over the next two weeks as agreed upon.    Attempted: Fulfilled - Patient has been able to implement all her action steps to manage stress as agreed upon over the past two weeks. Patient has been able to maintain not smoking after eating for 10 minutes, drink water, sip through straws, and practice deep breathing to deter smoking during mini quits.  Partial - Patient smoked once inside her car over the past two weeks. Patient is no longer tracking her smoking in a notebook, but keeping a mental record via a schedule.  Not met - Patient is not incorporating chewing gum or toothpicks during mini quits at this time.

## 2023-03-07 NOTE — Telephone Encounter (Signed)
-----   Message from Jerilee Field, MD sent at 03/07/2023 11:03 AM EDT ----- Please let Linda Mayer know her CT scan did not show anything worrisome as we discussed.  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  This is a common finding as we get older but one that needs to be monitored by the vascular surgeons.  Did she know about this and has she seen vascular?  If not I can make a referral.  Thanks.  ----- Message ----- From: Troy Sine, CMA Sent: 03/07/2023   8:50 AM EDT To: Jerilee Field, MD  Please review

## 2023-03-08 ENCOUNTER — Telehealth: Payer: Self-pay | Admitting: Urology

## 2023-03-08 DIAGNOSIS — I714 Abdominal aortic aneurysm, without rupture, unspecified: Secondary | ICD-10-CM

## 2023-03-08 NOTE — Telephone Encounter (Signed)
Please refer to recent ct findings

## 2023-03-08 NOTE — Telephone Encounter (Signed)
Tried calling patient per Dr. Mena Goes that referral has been placed to vascular surgeon. Left voiced message for return call.

## 2023-03-08 NOTE — Telephone Encounter (Signed)
Patient return called. Patient is made aware of Dr. Mena Goes recommendation and would like for Dr. Mena Goes to put in a referral to a vascular surgeon. Patient is aware a task will be sent to the MD for referral. Patient voiced understanding .

## 2023-03-09 ENCOUNTER — Telehealth: Payer: Self-pay

## 2023-03-09 NOTE — Telephone Encounter (Signed)
Patient returned your call from yesterday. I advised the patient that it was in reference to the referral to vascular surgery. She requested a callback at your earliest convenience.      Thank you

## 2023-03-12 NOTE — Telephone Encounter (Signed)
Patient is aware of Dr. Mena Goes recommendation and referral to Vascular surgeon was in, patient voiced understanding.

## 2023-03-16 ENCOUNTER — Encounter (HOSPITAL_COMMUNITY): Payer: Self-pay | Admitting: Emergency Medicine

## 2023-03-16 NOTE — Progress Notes (Addendum)
[name] denies chest pain ro shortness of breath.  Patient denies having any s/s of Covid in her household, also denies any known exposure to Covid.  Ms Linda Mayer denies . any s/s of upper or lower respiratory in the past 8 weeks.  Ms Linda Mayer has smoked for 50 years, she is down to 6 cigarettes a day, patient is working with [facility] to stop smoking. Ms Linda Mayer has a smoker's cough.  Ms Linda Mayer's PCP is Dr. Gerrie Nordmann, cardiologist is Dr. Luane School.  [facility] is being seen at [facility], they are evaluating Carotid stenosis and AAA.

## 2023-03-20 ENCOUNTER — Ambulatory Visit (HOSPITAL_BASED_OUTPATIENT_CLINIC_OR_DEPARTMENT_OTHER): Payer: Medicare Other | Admitting: Vascular Surgery

## 2023-03-20 ENCOUNTER — Encounter (HOSPITAL_COMMUNITY): Payer: Self-pay | Admitting: Emergency Medicine

## 2023-03-20 ENCOUNTER — Ambulatory Visit (HOSPITAL_COMMUNITY): Payer: Medicare Other

## 2023-03-20 ENCOUNTER — Ambulatory Visit (HOSPITAL_COMMUNITY)
Admission: RE | Admit: 2023-03-20 | Discharge: 2023-03-20 | Disposition: A | Discharge status: Home / Self Care | Payer: Medicare Other | Attending: Emergency Medicine | Admitting: Emergency Medicine

## 2023-03-20 ENCOUNTER — Other Ambulatory Visit: Payer: Self-pay

## 2023-03-20 ENCOUNTER — Ambulatory Visit (HOSPITAL_COMMUNITY): Payer: Medicare Other | Admitting: Vascular Surgery

## 2023-03-20 ENCOUNTER — Encounter (HOSPITAL_COMMUNITY)
Admission: RE | Disposition: A | Discharge status: Home / Self Care | Payer: Self-pay | Source: Home / Self Care | Attending: Emergency Medicine

## 2023-03-20 DIAGNOSIS — I739 Peripheral vascular disease, unspecified: Secondary | ICD-10-CM | POA: Insufficient documentation

## 2023-03-20 DIAGNOSIS — F17210 Nicotine dependence, cigarettes, uncomplicated: Secondary | ICD-10-CM | POA: Diagnosis not present

## 2023-03-20 DIAGNOSIS — C7A8 Other malignant neuroendocrine tumors: Secondary | ICD-10-CM | POA: Insufficient documentation

## 2023-03-20 DIAGNOSIS — I10 Essential (primary) hypertension: Secondary | ICD-10-CM | POA: Diagnosis not present

## 2023-03-20 DIAGNOSIS — Z79899 Other long term (current) drug therapy: Secondary | ICD-10-CM | POA: Insufficient documentation

## 2023-03-20 DIAGNOSIS — C342 Malignant neoplasm of middle lobe, bronchus or lung: Secondary | ICD-10-CM

## 2023-03-20 DIAGNOSIS — R918 Other nonspecific abnormal finding of lung field: Secondary | ICD-10-CM | POA: Diagnosis not present

## 2023-03-20 DIAGNOSIS — F418 Other specified anxiety disorders: Secondary | ICD-10-CM | POA: Diagnosis not present

## 2023-03-20 DIAGNOSIS — R911 Solitary pulmonary nodule: Secondary | ICD-10-CM | POA: Diagnosis present

## 2023-03-20 DIAGNOSIS — E039 Hypothyroidism, unspecified: Secondary | ICD-10-CM | POA: Diagnosis not present

## 2023-03-20 DIAGNOSIS — F17200 Nicotine dependence, unspecified, uncomplicated: Secondary | ICD-10-CM | POA: Diagnosis not present

## 2023-03-20 DIAGNOSIS — I2510 Atherosclerotic heart disease of native coronary artery without angina pectoris: Secondary | ICD-10-CM

## 2023-03-20 HISTORY — PX: BRONCHIAL BIOPSY: SHX5109

## 2023-03-20 HISTORY — PX: BRONCHIAL BRUSHINGS: SHX5108

## 2023-03-20 HISTORY — DX: Atherosclerotic heart disease of native coronary artery without angina pectoris: I25.10

## 2023-03-20 HISTORY — DX: Peripheral vascular disease, unspecified: I73.9

## 2023-03-20 HISTORY — DX: Abdominal aortic aneurysm, without rupture, unspecified: I71.40

## 2023-03-20 HISTORY — DX: Depression, unspecified: F32.A

## 2023-03-20 HISTORY — PX: BRONCHIAL NEEDLE ASPIRATION BIOPSY: SHX5106

## 2023-03-20 HISTORY — PX: BRONCHIAL WASHINGS: SHX5105

## 2023-03-20 LAB — BASIC METABOLIC PANEL
Anion gap: 7 (ref 5–15)
BUN: 21 mg/dL (ref 8–23)
CO2: 28 mmol/L (ref 22–32)
Calcium: 8.8 mg/dL — ABNORMAL LOW (ref 8.9–10.3)
Chloride: 106 mmol/L (ref 98–111)
Creatinine, Ser: 0.96 mg/dL (ref 0.44–1.00)
GFR, Estimated: >60 mL/min (ref >60)
Glucose, Bld: 106 mg/dL — ABNORMAL HIGH (ref 70–99)
Potassium: 4.6 mmol/L (ref 3.5–5.1)
Sodium: 141 mmol/L (ref 135–145)

## 2023-03-20 LAB — CULTURE, BAL-QUANTITATIVE W GRAM STAIN

## 2023-03-20 LAB — AEROBIC/ANAEROBIC CULTURE W GRAM STAIN (SURGICAL/DEEP WOUND)

## 2023-03-20 LAB — CBC
HCT: 43 % (ref 36.0–46.0)
Hemoglobin: 13.7 g/dL (ref 12.0–15.0)
MCH: 30 pg (ref 26.0–34.0)
MCHC: 31.9 g/dL (ref 30.0–36.0)
MCV: 94.3 fL (ref 80.0–100.0)
Platelets: 170 10*3/uL (ref 150–400)
RBC: 4.56 MIL/uL (ref 3.87–5.11)
RDW: 12.9 % (ref 11.5–15.5)
WBC: 7.2 10*3/uL (ref 4.0–10.5)
nRBC: 0 % (ref 0.0–0.2)

## 2023-03-20 SURGERY — BRONCHOSCOPY, WITH BIOPSY USING ELECTROMAGNETIC NAVIGATION
Anesthesia: General | Laterality: Right

## 2023-03-20 MED ORDER — FENTANYL CITRATE (PF) 250 MCG/5ML IJ SOLN
INTRAMUSCULAR | Status: DC | PRN
  Administered 2023-03-20: 100 ug via INTRAVENOUS

## 2023-03-20 MED ORDER — ACETAMINOPHEN 10 MG/ML IV SOLN: 1000.0000 mg | Freq: Once | INTRAVENOUS | Status: DC | PRN

## 2023-03-20 MED ORDER — LIDOCAINE 2% (20 MG/ML) 5 ML SYRINGE
INTRAMUSCULAR | Status: DC | PRN
  Administered 2023-03-20: 60 mg via INTRAVENOUS

## 2023-03-20 MED ORDER — SUGAMMADEX SODIUM 200 MG/2ML IV SOLN
INTRAVENOUS | Status: DC | PRN
  Administered 2023-03-20: 200 mg via INTRAVENOUS

## 2023-03-20 MED ORDER — PHENYLEPHRINE 80 MCG/ML (10ML) SYRINGE FOR IV PUSH (FOR BLOOD PRESSURE SUPPORT)
PREFILLED_SYRINGE | INTRAVENOUS | Status: DC | PRN
  Administered 2023-03-20 (×3): 80 ug via INTRAVENOUS
  Administered 2023-03-20: 160 ug via INTRAVENOUS
  Administered 2023-03-20: 80 ug via INTRAVENOUS

## 2023-03-20 MED ORDER — ONDANSETRON HCL 4 MG/2ML IJ SOLN
INTRAMUSCULAR | Status: DC | PRN
  Administered 2023-03-20: 4 mg via INTRAVENOUS

## 2023-03-20 MED ORDER — LACTATED RINGERS IV SOLN: INTRAVENOUS | Status: DC

## 2023-03-20 MED ORDER — CHLORHEXIDINE GLUCONATE 0.12 % MT SOLN: 15.0000 mL | Freq: Once | OROMUCOSAL | Status: AC

## 2023-03-20 MED ORDER — CHLORHEXIDINE GLUCONATE 0.12 % MT SOLN
15.0000 mL | Freq: Once | OROMUCOSAL | Status: AC
  Administered 2023-03-20: 15 mL via OROMUCOSAL

## 2023-03-20 MED ORDER — PROPOFOL 10 MG/ML IV BOLUS
INTRAVENOUS | Status: DC | PRN
  Administered 2023-03-20: 140 mg via INTRAVENOUS

## 2023-03-20 MED ORDER — FENTANYL CITRATE (PF) 100 MCG/2ML IJ SOLN: 25.0000 ug | INTRAMUSCULAR | Status: DC | PRN

## 2023-03-20 MED ORDER — DEXAMETHASONE SODIUM PHOSPHATE 10 MG/ML IJ SOLN
INTRAMUSCULAR | Status: DC | PRN
  Administered 2023-03-20: 10 mg via INTRAVENOUS

## 2023-03-20 MED ORDER — ROCURONIUM BROMIDE 10 MG/ML (PF) SYRINGE
PREFILLED_SYRINGE | INTRAVENOUS | Status: DC | PRN
  Administered 2023-03-20: 50 mg via INTRAVENOUS

## 2023-03-20 NOTE — Anesthesia Postprocedure Evaluation (Signed)
Anesthesia Post Note  Patient: Linda Mayer  Procedure(s) Performed: ROBOTIC ASSISTED NAVIGATIONAL BRONCHOSCOPY (Right) BRONCHIAL BRUSHINGS BRONCHIAL NEEDLE ASPIRATION BIOPSIES BRONCHIAL BIOPSIES BRONCHIAL WASHINGS     Patient location during evaluation: PACU Anesthesia Type: General Level of consciousness: awake and alert Pain management: pain level controlled Vital Signs Assessment: post-procedure vital signs reviewed and stable Respiratory status: spontaneous breathing, nonlabored ventilation, respiratory function stable and patient connected to nasal cannula oxygen Cardiovascular status: blood pressure returned to baseline and stable Postop Assessment: no apparent nausea or vomiting Anesthetic complications: no   No notable events documented.  Last Vitals:  Vitals:   03/20/23 1100 03/20/23 1115  BP: (!) 144/67 129/70  Pulse: (!) 59 (!) 57  Resp: 13 14  Temp:  36.7 C  SpO2: 93% 94%    Last Pain:  Vitals:   03/20/23 1115  TempSrc:   PainSc: 0-No pain                 Earl Lites P Jadie Comas

## 2023-03-20 NOTE — Op Note (Signed)
Video Bronchoscopy with Robotic Assisted Bronchoscopic Navigation   Date of Operation: 03/20/2023   Pre-op Diagnosis: Right middle lobe and right upper lobe pulmonary nodules  Post-op Diagnosis: Same  Surgeon: Levy Pupa  Assistants: None  Anesthesia: General endotracheal anesthesia  Operation: Flexible video fiberoptic bronchoscopy with robotic assistance and biopsies.  Estimated Blood Loss: Minimal  Complications: None  Indications and History: Linda Mayer is a 67 y.o. female with history of tobacco use.  She been followed by Dr. Judeth Horn in our office for right middle lobe and right upper lobe pulmonary nodules.  Recommendation made to achieve a tissue diagnosis via robotic assisted navigational bronchoscopy. The risks, benefits, complications, treatment options and expected outcomes were discussed with the patient.  The possibilities of pneumothorax, pneumonia, reaction to medication, pulmonary aspiration, perforation of a viscus, bleeding, failure to diagnose a condition and creating a complication requiring transfusion or operation were discussed with the patient who freely signed the consent.    Description of Procedure: The patient was seen in the Preoperative Area, was examined and was deemed appropriate to proceed.  The patient was taken to [facility] endoscopy room 3, identified as Dellie Catholic and the procedure verified as Flexible Video Fiberoptic Bronchoscopy.  A Time Out was held and the above information confirmed.   Prior to the date of the procedure a high-resolution CT scan of the chest was performed. Utilizing ION software program a virtual tracheobronchial tree was generated to allow the creation of distinct navigation pathways to the patient's parenchymal abnormalities. After being taken to the operating room general anesthesia was initiated and the patient  was orally intubated. The video fiberoptic bronchoscope was introduced via the endotracheal tube and a general  inspection was performed which showed normal right and left lung anatomy. Aspiration of the bilateral mainstems was completed to remove any remaining secretions. Robotic catheter inserted into patient's endotracheal tube.   Target #1 right middle lobe pulmonary nodule: The distinct navigation pathways prepared prior to this procedure were then utilized to navigate to patient's lesion identified on CT scan. The robotic catheter was secured into place and the vision probe was withdrawn.  Lesion location was approximated using fluoroscopy.  Local registration and targeting was performed using Cios three-dimensional imaging. Under fluoroscopic guidance transbronchial needle brushings, transbronchial needle biopsies, and transbronchial forceps biopsies were performed to be sent for cytology and pathology.  Target #2 right upper lobe pulmonary nodule: The distinct navigation pathways prepared prior to this procedure were then utilized to navigate to patient's lesion identified on CT scan. The robotic catheter was secured into place and the vision probe was withdrawn.  Lesion location was approximated using fluoroscopy.  Local registration and targeting was performed using Cios three-dimensional imaging. Under fluoroscopic guidance transbronchial needle brushings, transbronchial needle biopsies, and transbronchial forceps biopsies were performed to be sent for cytology and pathology. A bronchioalveolar lavage was performed in the right upper lobe adjacent to the nodule and sent for microbiology.  At the end of the procedure a general airway inspection was performed and there was no evidence of active bleeding. The bronchoscope was removed.  The patient tolerated the procedure well. There was no significant blood loss and there were no obvious complications. A post-procedural chest x-ray is pending.  Samples Target #1: 1. Transbronchial needle brushings from right middle lobe pulmonary nodule 2. Transbronchial  Wang needle biopsies from right middle lobe pulmonary nodule 3. Transbronchial forceps biopsies from right middle lobe pulmonary nodule  Samples Target #2: 1. Transbronchial needle  brushings from right upper lobe pulmonary nodule 2. Transbronchial Wang needle biopsies from right upper lobe pulmonary nodule 3. Transbronchial forceps biopsies from right upper lobe pulmonary nodule 4. Bronchoalveolar lavage from right upper lobe  Plans:  The patient will be discharged from the PACU to home when recovered from anesthesia and after chest x-ray is reviewed. We will review the cytology, pathology and microbiology results with the patient when they become available. Outpatient followup will be with Dr. Judeth Horn or Dr. Delton Coombes.    Levy Pupa, MD, PhD 03/20/2023, 10:20 AM [facility] [phone] or if no answer before 7:00PM call [phone] For any issues after 7:00PM please call eLink [phone]

## 2023-03-20 NOTE — Transfer of Care (Signed)
Immediate Anesthesia Transfer of Care Note  Patient: Linda Mayer  Procedure(s) Performed: ROBOTIC ASSISTED NAVIGATIONAL BRONCHOSCOPY (Right) BRONCHIAL BRUSHINGS BRONCHIAL NEEDLE ASPIRATION BIOPSIES BRONCHIAL BIOPSIES BRONCHIAL WASHINGS  Patient Location: PACU  Anesthesia Type:General  Level of Consciousness: awake, alert , and oriented  Airway & Oxygen Therapy: Patient Spontanous Breathing  Post-op Assessment: Report given to RN and Post -op Vital signs reviewed and stable  Post vital signs: Reviewed and stable  Last Vitals:  Vitals Value Taken Time  BP 138/67 03/20/23 1023  Temp    Pulse 75 03/20/23 1025  Resp 11 03/20/23 1025  SpO2 100 % 03/20/23 1025  Vitals shown include unvalidated device data.  Last Pain:  Vitals:   03/20/23 0621  TempSrc: Oral  PainSc: 0-No pain      Patients Stated Pain Goal: 0 (03/20/23 1610)  Complications: No notable events documented.

## 2023-03-20 NOTE — Anesthesia Preprocedure Evaluation (Addendum)
Anesthesia Evaluation  Patient identified by MRN, date of birth, ID band Patient awake    Reviewed: Allergy & Precautions, NPO status , Patient's Chart, lab work & pertinent test results  Airway Mallampati: II  TM Distance: >3 FB Neck ROM: Full    Dental  (+) Partial Upper   Pulmonary Current Smoker and Patient abstained from smoking. Pulmonary nodule    Pulmonary exam normal        Cardiovascular hypertension, Pt. on medications + CAD and + Peripheral Vascular Disease   Rhythm:Regular Rate:Normal     Neuro/Psych   Anxiety Depression    negative neurological ROS     GI/Hepatic Neg liver ROS,GERD  ,,  Endo/Other  Hypothyroidism    Renal/GU negative Renal ROS  negative genitourinary   Musculoskeletal negative musculoskeletal ROS (+)    Abdominal Normal abdominal exam  (+)   Peds  Hematology negative hematology ROS (+) Lab Results      Component                Value               Date                      WBC                      7.2                 03/20/2023                HGB                      13.7                03/20/2023                HCT                      43.0                03/20/2023                MCV                      94.3                03/20/2023                PLT                      170                 03/20/2023             Lab Results      Component                Value               Date                      NA                       141                 03/20/2023  K                        4.6                 03/20/2023                CO2                      28                  03/20/2023                GLUCOSE                  106 (H)             03/20/2023                BUN                      21                  03/20/2023                CREATININE               0.96                03/20/2023                CALCIUM                  8.8 (L)             03/20/2023                 GFRNONAA                 >60                 03/20/2023              Anesthesia Other Findings   Reproductive/Obstetrics                             Anesthesia Physical Anesthesia Plan  ASA: 3  Anesthesia Plan: General   Post-op Pain Management:    Induction: Intravenous  PONV Risk Score and Plan: 2 and Ondansetron, Dexamethasone, Treatment may vary due to age or medical condition and Midazolam  Airway Management Planned: Mask and Oral ETT  Additional Equipment: None  Intra-op Plan:   Post-operative Plan: Extubation in OR  Informed Consent: I have reviewed the patients History and Physical, chart, labs and discussed the procedure including the risks, benefits and alternatives for the proposed anesthesia with the patient or authorized representative who has indicated his/her understanding and acceptance.     Dental advisory given  Plan Discussed with: CRNA  Anesthesia Plan Comments:        Anesthesia Quick Evaluation

## 2023-03-20 NOTE — Discharge Instructions (Signed)
Flexible Bronchoscopy, Care After This sheet gives you information about how to care for yourself after your test. Your doctor may also give you more specific instructions. If you have problems or questions, contact your doctor. Follow these instructions at home: Eating and drinking When your numbness is gone and your cough and gag reflexes have come back, you may: Eat only soft foods. Slowly drink liquids. The day after the test, go back to your normal diet. Driving Do not drive for 24 hours if you were given a medicine to help you relax (sedative). Do not drive or use heavy machinery while taking prescription pain medicine. General instructions  Take over-the-counter and prescription medicines only as told by your doctor. Return to your normal activities as told. Ask what activities are safe for you. Do not use any products that have nicotine or tobacco in them. This includes cigarettes and e-cigarettes. If you need help quitting, ask your doctor. Keep all follow-up visits as told by your doctor. This is important. It is very important if you had a tissue sample (biopsy) taken. Get help right away if: You have shortness of breath that gets worse. You get light-headed. You feel like you are going to pass out (faint). You have chest pain. You cough up: More than a little blood. More blood than before. Summary Do not eat or drink anything (not even water) for 2 hours after your test, or until your numbing medicine wears off. Do not use cigarettes. Do not use e-cigarettes. Get help right away if you have chest pain.  Please call our office for any questions or concerns.  [phone].  This information is not intended to replace advice given to you by your health care provider. Make sure you discuss any questions you have with your health care provider. Document Released: 07/16/2009 Document Revised: 08/31/2017 Document Reviewed: 10/06/2016 Elsevier Patient Education  2020 Elsevier  Inc.  

## 2023-03-20 NOTE — H&P (Signed)
Linda Mayer is an 67 y.o. female.   Chief Complaint: RML Pulmonary nodule HPI:  67 year old woman with a history of tobacco use, by Dr. Judeth Horn in our office for small pulmonary nodules.  Most notable a 1.5 cm nodule in the right middle lobe.  Rounded and consistent with a possible carcinoid tumor.  She presents today for navigational bronchoscopy to obtain a tissue diagnosis.  Risks, benefits, rationale of the procedure explained and discussed.  She understands and agrees to proceed.  No new issues reported.   Past Medical History:  Diagnosis Date   AAA (abdominal aortic aneurysm) (HCC)    Anxiety    Complication of anesthesia    Pt states that her BP drops really low after anesthesia. Hard time waking up   Coronary artery disease    Depression    situational   Dizziness    GERD (gastroesophageal reflux disease)    Hypertension    Hypothyroidism    Peripheral vascular disease (HCC)     Past Surgical History:  Procedure Laterality Date   ABDOMINAL HYSTERECTOMY     BARTHOLIN CYST MARSUPIALIZATION     Removal   THYROIDECTOMY N/A 05/12/2016   Procedure: THYROIDECTOMY;  Surgeon: Franky Macho, MD;  Location: [facility];  Service: General;  Laterality: N/A;   VESICOVAGINAL FISTULA CLOSURE W/ TAH      Family History  Problem Relation Age of Onset   Heart disease Brother    Thyroid disease Brother    Hypotension Brother    Social History:  reports that she has been smoking cigarettes. She has a 20.00 pack-year smoking history. She has never used smokeless tobacco. She reports that she does not drink alcohol and does not use drugs.  Allergies:  Allergies  Allergen Reactions   Zetia [Ezetimibe] Diarrhea   Zocor [Simvastatin]     Muscle pain     Medications Prior to Admission  Medication Sig Dispense Refill   ALPRAZolam (XANAX) 0.5 MG tablet Take 0.5 mg by mouth 3 (three) times daily.     aspirin 81 MG chewable tablet Chew 81 mg by mouth daily.     Calcium  Carb-Cholecalciferol (CALCIUM 600 + D PO) Take 1 tablet by mouth 2 (two) times daily.     levothyroxine (SYNTHROID) 88 MCG tablet Take 88 mcg by mouth daily.     lisinopril (PRINIVIL,ZESTRIL) 10 MG tablet Take 10 mg by mouth daily.     mirtazapine (REMERON) 15 MG tablet Take 15 mg by mouth at bedtime.     naproxen sodium (ALEVE) 220 MG tablet Take 440 mg by mouth 2 (two) times daily as needed (pain).     rosuvastatin (CRESTOR) 10 MG tablet Take 10 mg by mouth 2 (two) times a week.     ciprofloxacin (CIPRO) 500 MG tablet Take 1 tablet (500 mg total) by mouth every 12 (twelve) hours. 14 tablet 0   VITAMIN D PO Take 1 capsule by mouth daily.      Results for orders placed or performed during the hospital encounter of 03/20/23 (from the past 48 hour(s))  Basic metabolic panel per protocol     Status: Abnormal   Collection Time: 03/20/23  6:18 AM  Result Value Ref Range   Sodium 141 135 - 145 mmol/L   Potassium 4.6 3.5 - 5.1 mmol/L    Comment: HEMOLYSIS AT THIS LEVEL MAY AFFECT RESULT   Chloride 106 98 - 111 mmol/L   CO2 28 22 - 32 mmol/L   Glucose, Bld 106 (  H) 70 - 99 mg/dL    Comment: Glucose reference range applies only to samples taken after fasting for at least 8 hours.   BUN 21 8 - 23 mg/dL   Creatinine, Ser 8.65 0.44 - 1.00 mg/dL   Calcium 8.8 (L) 8.9 - 10.3 mg/dL   GFR, Estimated >78 >46 mL/min    Comment: (NOTE) Calculated using the CKD-EPI Creatinine Equation (2021)    Anion gap 7 5 - 15    Comment: Performed at [facility], [address] [place], [place] [address]  CBC per protocol     Status: None   Collection Time: 03/20/23  6:18 AM  Result Value Ref Range   WBC 7.2 4.0 - 10.5 K/uL   RBC 4.56 3.87 - 5.11 MIL/uL   Hemoglobin 13.7 12.0 - 15.0 g/dL   HCT 28.4 13.2 - 44.0 %   MCV 94.3 80.0 - 100.0 fL   MCH 30.0 26.0 - 34.0 pg   MCHC 31.9 30.0 - 36.0 g/dL   RDW 10.2 72.5 - 36.6 %   Platelets 170 150 - 400 K/uL   nRBC 0.0 0.0 - 0.2 %    Comment: Performed at  [facility], [address] [place], [place] [address]   No results found.  Review of Systems  Blood pressure (!) 149/56, pulse 61, temperature 98.4 F (36.9 C), temperature source Oral, resp. rate 16, height 5\' 5"  (1.651 m), weight 63.5 kg, SpO2 96 %. Physical Exam  Gen: Pleasant, well-nourished, in no distress,  normal affect  ENT: No lesions,  mouth clear,  oropharynx clear, no postnasal drip  Neck: No JVD, no stridor  Lungs: No use of accessory muscles, no crackles or wheezing on normal respiration, no wheeze on forced expiration  Cardiovascular: RRR, heart sounds normal, no murmur or gallops, no peripheral edema  Abdomen: soft and NT, no HSM,  BS normal  Musculoskeletal: No deformities, no cyanosis or clubbing  Neuro: alert, awake, non focal  Skin: Warm, no lesions or rashes    Assessment/Plan Right middle lobe pulmonary nodule, other scattered small pulmonary nodules of unclear etiology.  Moderate risk and a smoker.  Plan for robotic assisted navigational bronchoscopy to assess the right middle lobe nodule, possibly smaller nodules if they are accessible.  Patient understands the risks, benefits and rationale.  No barriers identified.  Plan to proceed today.  Leslye Peer, MD 03/20/2023, 7:31 AM

## 2023-03-20 NOTE — Anesthesia Procedure Notes (Signed)
Procedure Name: Intubation Date/Time: 03/20/2023 9:09 AM  Performed by: Garfield Cornea, CRNAPre-anesthesia Checklist: Patient identified, Emergency Drugs available, Suction available and Patient being monitored Patient Re-evaluated:Patient Re-evaluated prior to induction Oxygen Delivery Method: Circle System Utilized Preoxygenation: Pre-oxygenation with 100% oxygen Induction Type: IV induction Ventilation: Mask ventilation without difficulty Laryngoscope Size: Mac and 3 Grade View: Grade I Tube type: Oral Tube size: 8.5 mm Number of attempts: 1 Airway Equipment and Method: Stylet Placement Confirmation: ETT inserted through vocal cords under direct vision, positive ETCO2 and breath sounds checked- equal and bilateral Secured at: 22 cm Tube secured with: Tape Dental Injury: Teeth and Oropharynx as per pre-operative assessment

## 2023-03-21 ENCOUNTER — Ambulatory Visit: Payer: Medicare Other

## 2023-03-21 DIAGNOSIS — Z0000 Encounter for general adult medical examination without abnormal findings: Secondary | ICD-10-CM

## 2023-03-21 NOTE — Progress Notes (Unsigned)
Appointment Outcome: Completed, Session #: 6                        Start time: 2:31pm   End time: 3:05pm   Total Mins: 34 minutes  AGREEMENTS SECTION  Overall Goal(s): Smoking Cessation (November 2024) Stress Management  Agreement/Action Steps:  Smoking cessation Aim to smoke 5 cigarettes per day Take out 5 cigarettes per day and put remaining cigarettes away 1 cigarette - after waking up 1 cigarette - after breakfast 1 cigarette - getting home from work 1 cigarette - after supper 1 cigarette - before bed Practice mini quits No smoking in car Extend timing of smoking after eating to 10 minutes Drink water when urge to smoke arise Chew gum or toothpicks Sip through straws Practice deep breathing to cope with withdrawal and urges to smoke Track smoking in notebook   Stress management Implement positive self-talk Practice deep breathing Conduct self-check-ins Take medication as prescribed by PCP                        Progress Notes:  Patient reported that she was able to smoke 5 cigarettes a day for 7 days before encountering a stressful situation that contributed to an increase of smoking 8 cigarettes per day for approximately 5 days. Patient found herself going back for more cigarettes after taking out the allotted number of cigarettes per day on the days that she smoked more. Patient stated that after she regrouped, she was able to reduce her smoking down to 5 cigarettes per day. Patient expressed that when she only smokes 5 cigarettes per day she feels accomplished.   Patient shared that she believes she managed her smoking better when stressed that she would previously. Patient stated that she was able to avoid reverting to smoking a pack or 1.5 packs of cigarettes per day when stressed. Patient stated that when she is stressed, she tries to keep herself busy to avoid smoking. Patient shared that she also engages in house cleaning and getting outside. Patient stated that  drinking water and sipping drinks through straws have been helpful in managing cravings.   Patient mentioned that she wakes up at 5:30am and has been able to refrain from smoking until 6-730 to have her first cigarette. Patient stated that this is good because she typically would smoke immediately after waking up. Patient has also extended the amount of time after eating before she smokes from 10 minutes to about 20-25 minutes. Patient explained that she continues to sit on her hands and time herself for how long she can sit after eating before she gives into the urge to smoke. Patient shared that she does not smoke in the car and wait to have another cigarette before going into work. Patient shared her smoking schedule that helps her keep track of her smoking. Patient   Patient stated that she had several incidents occurring at once that were stressful. Patient shared that the medication her PCP provides helps with keeping her calm to a degree, but she finds herself thinking about how to address or fix a problem with others that exacerbates her stress level. Patient rated her stress level at a 7/10. Patient mentioned that she is stressed because she is working on quitting smoking among other things.   Patient has been in reflection to determine how to best approach her stressors. Patient stated that she must work on prioritizing herself over others and not  taking on the burdens of other people. Patient stated that practicing deep breathing to cope with withdrawal symptoms and urges to smoke along with managing stress has been helpful in calming her down. Patient mentioned that at times deep breathing does not work as well or she must force herself to engage in the behavior. Patient noticed that deep breathing helps her refrain from smoking and engaging in stressful conversations  Patient stated that she wants to challenge herself by aiming to smoke between 3-5 cigarettes per day and want to be smoke free by  September 12th. Patient believes that once she can work herself down to three cigarettes per day that it should not be as difficult to quit smoking. Patient shared that she wants to wait until 8-8:30am to smoke her first cigarette. Patient has begun engaging in a support system with a group of individuals that have quit smoking and will continue to utilize the group as a strategy to cope with the process of quitting. Patient stated that she has been implementing positive self-talk to manage stress and work towards smoking cessation.   Patient stated that she has been thinking about a healthier snacking option that she chooses such as chocolate chip cookies and Fritos when she has the urge to eat to avoid smoking. Patient stated that she is mindful of how much of the snacks she consumes but feel that choosing pretzels at home may be helpful in addition to her eating fruit at work. Patient expressed that overall, she is motivated to quit smoking and is waiting for results of her lung biopsy. Patient feels that she is lucky.    Indicators of Success and Accountability:  Patient was able to regain control and reduce her smoking after experiencing a stressful period and going over her daily limit for a few days.   Readiness: Patient is in the action stage of smoking cessation and stress management.   Strengths and Supports: Patient currently does not have a support system. Patient is relying on being persistent with implementing her action steps.   Challenges and Barriers: Patient challenge to smoking cessation is refraining from increasing her smoking when stressed.     Coaching Outcomes: Patient revised action steps for smoking cessation and stress management during session as outlined below to implement over the next month.   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 No smoking in car Extend timing of smoking after eating to 20-25 minutes Drink water when urge to smoke arise Sip through straws Eat a healthy snack Practice deep breathing to cope with withdrawal and urges to smoke   Stress management Implement positive self-talk Engage in physical activity House cleaning or walking outside Practice deep breathing Conduct self-check-ins Take medication as prescribed by PCP       Attempted: Fulfilled - Patient has been able to practice mini quits by not smoking in the care, extending time after eating before smoking, drinking water, sipping through straws, and practicing deep breathing. Patient has implemented all action steps to manage stress over the past two weeks.   Partial - Patient was able to maintain smoking 5 cigarettes per day by following a smoking schedule and taking cigarettes out the pack and putting  the remaining cigarettes away 9 out of 14 days.   Not attempted: Dropped/Revised - Patient determined that chewing gum or toothpicks was not a helpful strategy for smoking cessation.

## 2023-03-22 ENCOUNTER — Telehealth: Payer: Self-pay | Admitting: Emergency Medicine

## 2023-03-22 LAB — CULTURE, BAL-QUANTITATIVE W GRAM STAIN

## 2023-03-22 LAB — ACID FAST SMEAR (AFB, MYCOBACTERIA): Acid Fast Smear: NEGATIVE

## 2023-03-22 LAB — CYTOLOGY - NON PAP

## 2023-03-22 LAB — AEROBIC/ANAEROBIC CULTURE W GRAM STAIN (SURGICAL/DEEP WOUND)

## 2023-03-22 NOTE — Telephone Encounter (Signed)
Called and reviewed bronchoscopy results with the patient.  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  Discussed with the patient that depending on how they evolve, symptoms she may merit surgical resection.  Certainly they would need to be followed with intermittent imaging.  She will follow-up with Dr. Judeth Horn to discuss the options.  FYI to Dr Judeth Horn

## 2023-03-23 ENCOUNTER — Encounter (HOSPITAL_COMMUNITY): Payer: Self-pay | Admitting: Emergency Medicine

## 2023-03-25 LAB — AEROBIC/ANAEROBIC CULTURE W GRAM STAIN (SURGICAL/DEEP WOUND): Culture: NO GROWTH

## 2023-03-26 ENCOUNTER — Other Ambulatory Visit: Payer: Self-pay | Admitting: *Deleted

## 2023-03-26 DIAGNOSIS — I6521 Occlusion and stenosis of right carotid artery: Secondary | ICD-10-CM

## 2023-03-29 ENCOUNTER — Other Ambulatory Visit: Payer: Self-pay

## 2023-03-29 NOTE — Progress Notes (Signed)
The proposed treatment discussed in conference is for discussion purpose only and is not a binding recommendation.  The patients have not been physically examined, or presented with their treatment options.  Therefore, final treatment plans cannot be decided.  

## 2023-04-09 NOTE — Progress Notes (Unsigned)
[facility] [place]  ASSESSMENT / PLAN: Linda Mayer is a 67 y.o. female with: # infrarenal abdominal aortic aneurysm measuring 31mm.  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  Recommend:  Abstinence from all tobacco products. Blood glucose control with goal A1c < 7%. Blood pressure control with goal blood pressure < 140/90 mmHg. Lipid reduction therapy with goal LDL-C <100 mg/dL.  Aspirin 81mg  PO QD.  Atorvastatin 40-80mg  PO QD (or other "high intensity" statin therapy).  Continue surveillance of carotid stenosis with duplex in 6 months. Recheck AAA with duplex in 3 years.   CHIEF COMPLAINT: ***  HISTORY OF PRESENT ILLNESS: Linda Mayer is a 67 y.o. female ***  VASCULAR SURGICAL HISTORY: ***  VASCULAR RISK FACTORS: {FINDINGS; POSITIVE NEGATIVE:(641) 165-7806} history of stroke / transient ischemic attack. {FINDINGS; POSITIVE NEGATIVE:(641) 165-7806} history of coronary artery disease. *** history of PCI. *** history of CABG.  {FINDINGS; POSITIVE NEGATIVE:([phone] [phone]} history of diabetes mellitus. Last A1c ***. {FINDINGS; POSITIVE NEGATIVE:(641) 165-7806} history of smoking. *** actively smoking. {FINDINGS; POSITIVE NEGATIVE:(641) 165-7806} history of hypertension. *** drug regimen with *** control. {FINDINGS; POSITIVE NEGATIVE:([phone] 165-7806} history of chronic kidney disease.  Last GFR ***. CKD {stage:30421363}. {FINDINGS; POSITIVE NEGATIVE:([phone] 165-7806} history of chronic obstructive pulmonary disease, treated with ***.  FUNCTIONAL STATUS: ECOG performance status: {findings; ecog performance status:31780} Ambulatory status: {TNHAmbulation:25868}  CAREY 1 AND 3 YEAR INDEX Female (2pts) 75-79 or 80-84 (2pts) >84 (3pts) Dependence in toileting (1pt) Partial or full dependence in dressing (1pt) History of malignant neoplasm (2pts) CHF (3pts) COPD (1pts) CKD (3pts)  0-3 pts 6% 1 year mortality ; 21% 3 year mortality 4-5 pts 12% 1 year mortality ; 36% 3 year  mortality >5 pts 21% 1 year mortality; 54% 3 year mortality   Past Medical History:  Diagnosis Date   AAA (abdominal aortic aneurysm) (HCC)    Anxiety    Complication of anesthesia    Pt states that her BP drops really low after anesthesia. Hard time waking up   Coronary artery disease    Depression    situational   Dizziness    GERD (gastroesophageal reflux disease)    Hypertension    Hypothyroidism    Peripheral vascular disease (HCC)     Past Surgical History:  Procedure Laterality Date   ABDOMINAL HYSTERECTOMY     BARTHOLIN CYST MARSUPIALIZATION     Removal   BRONCHIAL BIOPSY  03/20/2023   Procedure: BRONCHIAL BIOPSIES;  Surgeon: Leslye Peer, MD;  Location: [facility] ENDOSCOPY;  Service: Pulmonary;;   BRONCHIAL BRUSHINGS  03/20/2023   Procedure: BRONCHIAL BRUSHINGS;  Surgeon: Leslye Peer, MD;  Location: [facility] ENDOSCOPY;  Service: Pulmonary;;   BRONCHIAL NEEDLE ASPIRATION BIOPSY  03/20/2023   Procedure: BRONCHIAL NEEDLE ASPIRATION BIOPSIES;  Surgeon: Leslye Peer, MD;  Location: [facility] END[facility]COPY;  Service: Pulmonary;;   BRONCHIAL WASHINGS  03/20/2023   Procedure: BRONCHIAL WASHINGS;  Surgeon: Leslye Peer, MD;  Location: [facility] ENDOSCOPY;  Service: Pulmonary;;   THYROIDECTOMY N/A 05/12/2016   Procedure: THYROIDECTOMY;  Surgeon: Franky Macho, MD;  Location: [facility];  Service: General;  Laterality: N/A;   VESICOVAGINAL FISTULA CLOSURE W/ TAH      Family History  Problem Relation Age of Onset   Heart disease Brother    Thyroid disease Brother    Hypotension Brother     Social History   Socioeconomic History   Marital status: Married    Spouse name: Not on file   Number of children: Not on file   Years  of education: Not on file   Highest education level: Not on file  Occupational History   Not on file  Tobacco Use   Smoking status: Every Day    Packs/day: 0.40    Years: 50.00    Additional pack years: 0.00    Total pack years: 20.00    Types: Cigarettes   Smokeless  tobacco: Never   Tobacco comments:    Currently at 10 cigarettes per day.   Vaping Use   Vaping Use: Never used  Substance and Sexual Activity   Alcohol use: No   Drug use: No   Sexual activity: Not on file  Other Topics Concern   Not on file  Social History Narrative   Not on file   Social Determinants of Health   Financial Resource Strain: Not on file  Food Insecurity: Not on file  Transportation Needs: Not on file  Physical Activity: Not on file  Stress: Not on file  Social Connections: Not on file  Intimate Partner Violence: Not on file    Allergies  Allergen Reactions   Zetia [Ezetimibe] Diarrhea   Zocor [Simvastatin]     Muscle pain     Current Outpatient Medications  Medication Sig Dispense Refill   ALPRAZolam (XANAX) 0.5 MG tablet Take 0.5 mg by mouth 3 (three) times daily.     aspirin 81 MG chewable tablet Chew 81 mg by mouth daily.     Calcium Carb-Cholecalciferol (CALCIUM 600 + D PO) Take 1 tablet by mouth 2 (two) times daily.     ciprofloxacin (CIPRO) 500 MG tablet Take 1 tablet (500 mg total) by mouth every 12 (twelve) hours. 14 tablet 0   levothyroxine (SYNTHROID) 88 MCG tablet Take 88 mcg by mouth daily.     lisinopril (PRINIVIL,ZESTRIL) 10 MG tablet Take 10 mg by mouth daily.     mirtazapine (REMERON) 15 MG tablet Take 15 mg by mouth at bedtime.     naproxen sodium (ALEVE) 220 MG tablet Take 440 mg by mouth 2 (two) times daily as needed (pain).     rosuvastatin (CRESTOR) 10 MG tablet Take 10 mg by mouth 2 (two) times a week.     VITAMIN D PO Take 1 capsule by mouth daily.     No current facility-administered medications for this visit.    PHYSICAL EXAM There were no vitals filed for this visit.  Constitutional: *** appearing. *** distress. Appears *** nourished.  Neurologic: CN ***. *** focal findings. *** sensory loss. Psychiatric: *** Mood and affect symmetric and appropriate. Eyes: *** No icterus. No conjunctival pallor. Ears, nose, throat:  *** mucous membranes moist. Midline trachea.  Cardiac: *** rate and rhythm.  Respiratory: *** unlabored. Abdominal: *** soft, non-tender, non-distended.  Peripheral vascular: *** Extremity: *** edema. *** cyanosis. *** pallor.  Skin: *** gangrene. *** ulceration.  Lymphatic: *** Stemmer's sign. *** palpable lymphadenopathy.    PERTINENT LABORATORY AND RADIOLOGIC DATA  Most recent CBC    Latest Ref Rng & Units 03/20/2023    6:18 AM 05/13/2016    5:41 AM 05/08/2016    9:20 AM  CBC  WBC 4.0 - 10.5 K/uL 7.2  10.4  6.5   Hemoglobin 12.0 - 15.0 g/dL 16.1  09.6  04.5   Hematocrit 36.0 - 46.0 % 43.0  38.0  42.6   Platelets 150 - 400 K/uL 170  171  165      Most recent CMP    Latest Ref Rng & Units 03/20/2023  6:18 AM 05/13/2016    5:41 AM 05/12/2016    3:52 PM  CMP  Glucose 70 - 99 mg/dL 161  096  045   BUN 8 - 23 mg/dL 21  16  17    Creatinine 0.44 - 1.00 mg/dL 4.09  8.11  9.14   Sodium 135 - 145 mmol/L 141  139  137   Potassium 3.5 - 5.1 mmol/L 4.6  3.9  4.5   Chloride 98 - 111 mmol/L 106  107  108   CO2 22 - 32 mmol/L 28  27  26    Calcium 8.9 - 10.3 mg/dL 8.8  8.9  8.1   Total Protein 6.5 - 8.1 g/dL  6.3  6.6   Total Bilirubin 0.3 - 1.2 mg/dL  0.7  0.5   Alkaline Phos 38 - 126 U/L  82  87   AST 15 - 41 U/L  16  19   ALT 14 - 54 U/L  14  17     Renal function CrCl cannot be calculated (Unknown ideal weight.).  No results found for: "HGBA1C"  No results found for: "LDLCALC", "LDLC", "HIRISKLDL", "POCLDL", "LDLDIRECT", "REALLDLC", "TOTLDLC"   Vascular Imaging: ***  Linda Ditto N. Lenell Antu, MD FACS [facility] Phone Number: [phone] 04/09/2023 8:29 AM   Total time spent on preparing this encounter including chart review, data review, collecting history, examining the patient, coordinating care for this {tnhtimebilling:26202}  Portions of this report may have been transcribed using voice recognition software.  Every effort has been  made to ensure accuracy; however, inadvertent computerized transcription errors may still be present.

## 2023-04-10 ENCOUNTER — Ambulatory Visit: Payer: Medicare Other | Admitting: Vascular Surgery

## 2023-04-10 ENCOUNTER — Encounter: Payer: Self-pay | Admitting: Vascular Surgery

## 2023-04-10 ENCOUNTER — Ambulatory Visit (HOSPITAL_COMMUNITY)
Admission: RE | Admit: 2023-04-10 | Discharge: 2023-04-10 | Disposition: A | Discharge status: Home / Self Care | Payer: Medicare Other | Source: Ambulatory Visit | Attending: Vascular Surgery | Admitting: Vascular Surgery

## 2023-04-10 VITALS — BP 131/73 | HR 64 | Temp 98.0°F | Resp 20 | Ht 65.0 in | Wt 137.0 lb

## 2023-04-10 DIAGNOSIS — I6521 Occlusion and stenosis of right carotid artery: Secondary | ICD-10-CM | POA: Diagnosis not present

## 2023-04-10 DIAGNOSIS — I7143 Infrarenal abdominal aortic aneurysm, without rupture: Secondary | ICD-10-CM | POA: Diagnosis not present

## 2023-04-10 DIAGNOSIS — I6523 Occlusion and stenosis of bilateral carotid arteries: Secondary | ICD-10-CM | POA: Diagnosis not present

## 2023-04-16 ENCOUNTER — Ambulatory Visit (INDEPENDENT_AMBULATORY_CARE_PROVIDER_SITE_OTHER): Payer: Medicare Other | Admitting: Emergency Medicine

## 2023-04-16 DIAGNOSIS — R911 Solitary pulmonary nodule: Secondary | ICD-10-CM | POA: Diagnosis not present

## 2023-04-16 LAB — PULMONARY FUNCTION TEST
DL/VA % pred: 117 %
DL/VA: 4.88 ml/min/mmHg/L
DLCO cor % pred: 109 %
DLCO cor: 22.52 ml/min/mmHg
DLCO unc % pred: 110 %
DLCO unc: 22.73 ml/min/mmHg
FEF 25-75 Post: 1.68 L/sec
FEF 25-75 Pre: 1.54 L/sec
FEF2575-%Change-Post: 9 %
FEF2575-%Pred-Post: 76 %
FEF2575-%Pred-Pre: 70 %
FEV1-%Change-Post: 2 %
FEV1-%Pred-Post: 80 %
FEV1-%Pred-Pre: 78 %
FEV1-Post: 2.01 L
FEV1-Pre: 1.97 L
FEV1FVC-%Change-Post: 2 %
FEV1FVC-%Pred-Pre: 97 %
FEV6-%Change-Post: 1 %
FEV6-%Pred-Post: 82 %
FEV6-%Pred-Pre: 81 %
FEV6-Post: 2.6 L
FEV6-Pre: 2.58 L
FEV6FVC-%Change-Post: 1 %
FEV6FVC-%Pred-Post: 103 %
FEV6FVC-%Pred-Pre: 102 %
FVC-%Change-Post: 0 %
FVC-%Pred-Post: 79 %
FVC-%Pred-Pre: 79 %
FVC-Post: 2.63 L
FVC-Pre: 2.62 L
Post FEV1/FVC ratio: 77 %
Post FEV6/FVC ratio: 99 %
Pre FEV1/FVC ratio: 75 %
Pre FEV6/FVC Ratio: 98 %
RV % pred: 134 %
RV: 2.88 L
TLC % pred: 107 %
TLC: 5.58 L

## 2023-04-16 NOTE — Progress Notes (Signed)
 Full PFT performed today. °

## 2023-04-16 NOTE — Patient Instructions (Signed)
 Full PFT performed today. °

## 2023-04-17 ENCOUNTER — Telehealth: Payer: Self-pay | Admitting: Internal Medicine

## 2023-04-17 ENCOUNTER — Encounter: Payer: Self-pay | Admitting: Pulmonary Disease

## 2023-04-17 ENCOUNTER — Ambulatory Visit: Payer: Medicare Other | Admitting: Pulmonary Disease

## 2023-04-17 VITALS — BP 102/64 | HR 62 | Ht 65.0 in | Wt 136.0 lb

## 2023-04-17 DIAGNOSIS — D3A090 Benign carcinoid tumor of the bronchus and lung: Secondary | ICD-10-CM | POA: Diagnosis not present

## 2023-04-17 DIAGNOSIS — F17210 Nicotine dependence, cigarettes, uncomplicated: Secondary | ICD-10-CM

## 2023-04-17 MED ORDER — STIOLTO RESPIMAT 2.5-2.5 MCG/ACT IN AERS: 2.0000 | INHALATION_SPRAY | Freq: Every day | RESPIRATORY_TRACT | 0 refills | Status: DC

## 2023-04-17 NOTE — Progress Notes (Signed)
@Patient  ID: Linda Mayer, female    DOB: 06/25/1956, 67 y.o.   MRN: [id number]  Chief Complaint  Patient presents with   Follow-up    66 mo f/u after PFT. States she has been doing well since last visit.     Referring provider: [facility]  HPI:   67 y.o. 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  Most recent PCP note reviewed.  Overall doing well.  Underwent navigational robotic bronchoscopy with biopsy in the interim.  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  Results were discussed with her by the bronchoscopist, Dr. Delton Coombes.  Will need to does not feel lung cancer.  Discussed that these are often slow-growing tumors.  From a lung standpoint mainly can cause airway obstruction if growing in the airway.  Sometimes causes flushing nausea vomiting diarrhea when outside of the GI tract she is having none of this.  Discussed role and rationale of an opinion from a Careers adviser.  Would like.  She endorses some dyspnea on exertion with stairs or other things etc.  Discussed trial of inhaler which she would like to try.  Reviewed PFTs in interim, this does reveal air trapping otherwise normal.  HPI at initial visit: Patient was in usual state of health.  Offered a CT coronary scan for healthcare maintenance.  Denies significant dyspnea.  This revealed some coronary calcification.  But on my review interpretation mostly notable for scattered nodules largest of which is 1.4 cm in the right middle lobe.  Rounded appearing.  She is currently smoking.  Working on cutting back.  Denies any dyspnea.  Minimal cough but sometimes a problem.  Usually in the mornings and evenings.  Throughout the day not much of a big deal.  We discussed at length the etiology of nodules.  As a pertains to her.  Whenever Mayo calculator risk.  Discussed roles including surveillance, biopsy, PET scan.  Recommended repeat CT scan  with super D for possible navigational bronchoscopy, no lymphadenopathy seen on prior CT scan.  Discussed PET scan but given possible false negative and need for repeat super D CT if PET avid, decided to straight towards super D and likely biopsy if no smaller.  If smaller can do surveillance.  If same size or enlarging will pursue biopsy.  PMH: Tobacco abuse, seasonal allergies, hyperlipidemia, hypertension Surgical history: Reviewed with patient, she denies any Family history: First-degree relatives with allergies, no significant respiratory disease Social history: Current smoker, down to close to half a pack, 66+ pack years, lives in [place] / Pulmonary Flowsheets:   ACT:      No data to display          MMRC:     No data to display          Epworth:      No data to display          Tests:   FENO:  No results found for: "NITRICOXIDE"  PFT:    Latest Ref Rng & Units 04/16/2023    2:43 PM  PFT Results  FVC-Pre L 2.62  P  FVC-Predicted Pre % 79  P  FVC-Post L 2.63  P  FVC-Predicted Post % 79  P  Pre FEV1/FVC % % 75  P  Post FEV1/FCV % % 77  P  FEV1-Pre L 1.97  P  FEV1-Predicted Pre % 78  P  FEV1-Post L 2.01  P  DLCO uncorrected ml/min/mmHg 22.73  P  DLCO UNC% % 110  P  DLCO corrected ml/min/mmHg 22.52  P  DLCO COR %Predicted % 109  P  DLVA Predicted % 117  P  TLC L 5.58  P  TLC % Predicted % 107  P  RV % Predicted % 134  P    P Preliminary result  Personally reviewed interpret as normal spirometry.  No bronchodilator response.  Lung volumes consistent with air trapping.  DLCO within normal limits  WALK:      No data to display          Imaging: Personally reviewed and as per EMR discussion in this note VAS US CAROTID  Result Date: 04/10/2023 Carotid Arterial Duplex Study Patient Name:  Linda Mayer [facility]  Date of Exam:   04/10/2023 Medical Rec #: [id number]        Accession #:    [id number] Date of Birth: 1956/07/12        Patient Gender:  F Patient Age:   67 years Exam Location:  [facility] Procedure:      VAS US CAROTID Referring Phys: Heath Lark --------------------------------------------------------------------------------  Indications:       Carotid artery disease. Risk Factors:      Hyperlipidemia, current smoker. Comparison Study:  10/18/22 at [facility]: Right 201/66 cm/s Left 108/25 cm/s Performing Technologist: Thereasa Parkin RVT  Examination Guidelines: A complete evaluation includes B-mode imaging, spectral Doppler, color Doppler, and power Doppler as needed of all accessible portions of each vessel. Bilateral testing is considered an integral part of a complete examination. Limited examinations for reoccurring indications may be performed as noted.  Right Carotid Findings: +----------+--------+--------+--------+------------------+--------+           PSV cm/sEDV cm/sStenosisPlaque DescriptionComments +----------+--------+--------+--------+------------------+--------+ CCA Prox  102     22                                         +----------+--------+--------+--------+------------------+--------+ CCA Mid   88      32                                         +----------+--------+--------+--------+------------------+--------+ CCA Distal90      31                                         +----------+--------+--------+--------+------------------+--------+ ICA Prox  210     80      60-79%  heterogenous               +----------+--------+--------+--------+------------------+--------+ ICA Mid   125     27                                         +----------+--------+--------+--------+------------------+--------+ ICA Distal111     46                                         +----------+--------+--------+--------+------------------+--------+ ECA       147  23              heterogenous               +----------+--------+--------+--------+------------------+--------+  +----------+--------+-------+----------------+-------------------+           PSV cm/sEDV cmsDescribe        Arm Pressure (mmHG) +----------+--------+-------+----------------+-------------------+ Subclavian201     0      Multiphasic, [id number]                 +----------+--------+-------+----------------+-------------------+ +---------+--------+--+--------+--+---------+ VertebralPSV cm/s90EDV cm/s22Antegrade +---------+--------+--+--------+--+---------+ Left Carotid Findings: +----------+--------+--------+--------+------------------+--------+           PSV cm/sEDV cm/sStenosisPlaque DescriptionComments +----------+--------+--------+--------+------------------+--------+ CCA Prox  112     28                                         +----------+--------+--------+--------+------------------+--------+ CCA Mid   106     35                                         +----------+--------+--------+--------+------------------+--------+ CCA Distal89      32                                         +----------+--------+--------+--------+------------------+--------+ ICA Prox  278     110     60-79%  heterogenous               +----------+--------+--------+--------+------------------+--------+ ICA Mid   157     41                                         +----------+--------+--------+--------+------------------+--------+ ICA Distal134     38                                         +----------+--------+--------+--------+------------------+--------+ ECA       131     18                                         +----------+--------+--------+--------+------------------+--------+ +----------+--------+--------+----------------+-------------------+           PSV cm/sEDV cm/sDescribe        Arm Pressure (mmHG) +----------+--------+--------+----------------+-------------------+ [id number]     29      Multiphasic, [id number]                  +----------+--------+--------+----------------+-------------------+ +---------+--------+---+--------+--+---------+ VertebralPSV cm/s118EDV cm/s46Antegrade +---------+--------+---+--------+--+---------+ Summary: Right Carotid: Velocities in the right ICA are consistent with a 60-79%                stenosis. Left Carotid: Velocities in the left ICA are consistent with a 60-79% stenosis. Vertebrals:  Bilateral vertebral arteries demonstrate antegrade flow. Subclavians: Normal flow hemodynamics were seen in bilateral subclavian              arteries. *See table(s) above for measurements and observations.  Electronically signed by Heath Lark on 04/10/2023 at 9:08:50 AM.    Final  DG Chest Port 1 View  Result Date: 03/20/2023 CLINICAL DATA:  Status post bronchoscopy. EXAM: PORTABLE CHEST 1 VIEW COMPARISON:  08/31/2010 FINDINGS: The lungs are clear without focal pneumonia, edema, pneumothorax or pleural effusion. Nodular opacity again noted right mid lung as better visualized on chest CT 02/21/2023. The cardiopericardial silhouette is within normal limits for size. Telemetry leads overlie the chest. IMPRESSION: 1. No pneumothorax status post bronchoscopy. 2. Nodular opacity right mid lung as better visualized on chest CT 02/21/2023. Electronically Signed   By: Kennith Center M.D.   On: 03/20/2023 11:52   DG C-ARM BRONCHOSCOPY  Result Date: 03/20/2023 C-ARM BRONCHOSCOPY: Fluoroscopy was utilized by the requesting physician.  No radiographic interpretation.   DG C-Arm 1-60 Min-No Report  Result Date: 03/20/2023 Fluoroscopy was utilized by the requesting physician.  No radiographic interpretation.    Lab Results: Personally reviewed and as per EMR CBC    Component Value Date/Time   WBC 7.2 03/20/2023 0618   RBC 4.56 03/20/2023 0618   HGB 13.7 03/20/2023 0618   HCT 43.0 03/20/2023 0618   PLT 170 03/20/2023 0618   MCV 94.3 03/20/2023 0618   MCH 30.0 03/20/2023 0618   MCHC 31.9 03/20/2023 0618    RDW 12.9 03/20/2023 0618   LYMPHSABS 2.8 05/08/2016 0920   MONOABS 0.4 05/08/2016 0920   EOSABS 0.1 05/08/2016 0920   BASOSABS 0.0 05/08/2016 0920    BMET    Component Value Date/Time   NA 141 03/20/2023 0618   K 4.6 03/20/2023 0618   CL 106 03/20/2023 0618   CO2 28 03/20/2023 0618   GLUCOSE 106 (H) 03/20/2023 0618   BUN 21 03/20/2023 0618   CREATININE 0.96 03/20/2023 0618   CALCIUM 8.8 (L) 03/20/2023 0618   GFRNONAA >60 03/20/2023 0618   GFRAA >60 05/13/2016 0541    BNP No results found for: "BNP"  ProBNP No results found for: "PROBNP"  Specialty Problems       Pulmonary Problems   Pulmonary nodules    Allergies  Allergen Reactions   Zetia [Ezetimibe] Diarrhea   Zocor [Simvastatin]     Muscle pain      There is no immunization history on file for this patient.  Past Medical History:  Diagnosis Date   AAA (abdominal aortic aneurysm) (HCC)    Anxiety    Complication of anesthesia    Pt states that her BP drops really low after anesthesia. Hard time waking up   Coronary artery disease    Depression    situational   Dizziness    GERD (gastroesophageal reflux disease)    Hypertension    Hypothyroidism    Peripheral vascular disease (HCC)     Tobacco History: Social History   Tobacco Use  Smoking Status Every Day   Current packs/day: 0.50   Average packs/day: 0.5 packs/day for 50.0 years (25.0 ttl pk-yrs)   Types: Cigarettes  Smokeless Tobacco Never  Tobacco Comments   Currently at 10 cigarettes per day.    Ready to quit: Not Answered Counseling given: Not Answered Tobacco comments: Currently at 10 cigarettes per day.    Continue to not smoke  Outpatient Encounter Medications as of 04/17/2023  Medication Sig   ALPRAZolam (XANAX) 0.5 MG tablet Take 0.5 mg by mouth 3 (three) times daily.   aspirin 81 MG chewable tablet Chew 81 mg by mouth daily.   Calcium Carb-Cholecalciferol (CALCIUM 600 + D PO) Take 1 tablet by mouth 2 (two) times  daily.   ciprofloxacin (CIPRO)  500 MG tablet Take 1 tablet (500 mg total) by mouth every 12 (twelve) hours.   levothyroxine (SYNTHROID) 88 MCG tablet Take 88 mcg by mouth daily.   lisinopril (PRINIVIL,ZESTRIL) 10 MG tablet Take 10 mg by mouth daily.   mirtazapine (REMERON) 15 MG tablet Take 15 mg by mouth at bedtime.   naproxen sodium (ALEVE) 220 MG tablet Take 440 mg by mouth 2 (two) times daily as needed (pain).   rosuvastatin (CRESTOR) 10 MG tablet Take 10 mg by mouth 2 (two) times a week.   VITAMIN D PO Take 1 capsule by mouth daily.   No facility-administered encounter medications on file as of 04/17/2023.     Review of Systems  Review of Systems  No chest pain with exertion.  No orthopnea or PND.  Comprehensive review of systems otherwise negative. Physical Exam  BP 102/64   Pulse 62   Ht 5\' 5"  (1.651 m)   Wt 136 lb (61.7 kg)   SpO2 98% Comment: on RA  BMI 22.63 kg/m   Wt Readings from Last 5 Encounters:  04/17/23 136 lb (61.7 kg)  04/10/23 137 lb (62.1 kg)  03/20/23 140 lb (63.5 kg)  02/16/23 141 lb 3.2 oz (64 kg)  11/06/22 142 lb 6.4 oz (64.6 kg)    BMI Readings from Last 5 Encounters:  04/17/23 22.63 kg/m  04/10/23 22.80 kg/m  03/20/23 23.30 kg/m  02/16/23 23.50 kg/m  11/06/22 23.70 kg/m     Physical Exam General: Sitting in chair, no acute distress Eyes: EOMI, no icterus Neck: Supple, no JVP Pulmonary: Distant, clear Cardiovascular: Warm, no edema Abdomen: Nondistended, bowel sounds present MSK: No synovitis, no joint effusion Neuro: Normal gait, no weakness Psych: Normal mood, full affect   Assessment & Plan:   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  Relatively stable on serial imaging.  Will need serial images moving forward.  She would like the opinion of the surgeon.  Referral placed today.  Further imaging at their discretion or I am happy to arrange ongoing surveillance imaging if needed.  Dyspnea on  exertion: PFTs largely normal with air trapping.  Air trapping certainly can cause dyspnea.  Trial Stiolto for dual bronchodilators.  Assess response.  Samples today.  Can prescribe if beneficial, asked her to send a MyChart message if she finds inhaler helpful.  Tobacco abuse:Smoking assessment and cessation counseling I have advised the patient to quit/stop smoking as soon as possible due to high risk for multiple medical problems.  It will also be very difficult for [place] to manage patient's  respiratory symptoms and status if we continue to expose her lungs to a known irritant.  We do not advise e-cigarettes as a form of stopping smoking. Patient is willing to quit smoking. I have advised the patient that we can assist and have options of nicotine replacement therapy, provided smoking cessation education today, provided smoking cessation counseling, and provided cessation resources. Follow-up next office visit office visit for assessment of smoking cessation.  I spent 3 minutes in smoking cessation counseling.    Return in about 3 months (around 07/18/2023).   Karren Burly, MD 04/17/2023

## 2023-04-17 NOTE — Addendum Note (Signed)
Addended by: Maurene Capes on: 04/17/2023 02:01 PM   Modules accepted: Orders

## 2023-04-17 NOTE — Telephone Encounter (Signed)
Patient needs her nurse patient reschedule. Please advise

## 2023-04-17 NOTE — Telephone Encounter (Signed)
Left message for pt to call back to reschedule health coaching.

## 2023-04-17 NOTE — Patient Instructions (Signed)
Nice to see you again  I sent a referral to a lung surgeon today for discussion regarding if there is any role for surgery for these carcinoid tumors in the lung  I like to see you back in about 3 months.  Can plan on repeat CT scan at the 79-month interval which would be November 2024 assuming no additional action or changes are made at the surgical appointment.  For the shortness of breath, try Stiolto 2 puffs once a day, in the morning.  If you find this helps him me a message on MyChart and I can prescribe it, samples today.  I am glad you are working on decreasing your cigarette smoking, keep up the good work.  Return to clinic in 3 months or sooner as needed with Dr. Judeth Horn

## 2023-04-18 ENCOUNTER — Telehealth: Payer: Self-pay

## 2023-04-18 DIAGNOSIS — Z0000 Encounter for general adult medical examination without abnormal findings: Secondary | ICD-10-CM

## 2023-04-18 NOTE — Telephone Encounter (Signed)
Left message for pt, appointment has been rescheduled and will be at the [facility].

## 2023-04-18 NOTE — Telephone Encounter (Addendum)
Called patient to reschedule missed health coaching appointment. Patient has been schedule for a telephone visit on 04/25/23 at 10:15am.   Renaee Munda, MS, ERHD, [facility]  Care Guide, Health & Wellness Coach [address], [address] [place] [address] Telephone: [phone] Email: [email or web address]

## 2023-04-19 LAB — FUNGUS CULTURE RESULT

## 2023-04-19 LAB — FUNGAL ORGANISM REFLEX

## 2023-04-19 LAB — FUNGUS CULTURE WITH STAIN

## 2023-04-20 ENCOUNTER — Ambulatory Visit: Payer: Medicare Other

## 2023-04-24 ENCOUNTER — Other Ambulatory Visit: Payer: Self-pay

## 2023-04-24 DIAGNOSIS — I6523 Occlusion and stenosis of bilateral carotid arteries: Secondary | ICD-10-CM

## 2023-04-25 ENCOUNTER — Telehealth (HOSPITAL_BASED_OUTPATIENT_CLINIC_OR_DEPARTMENT_OTHER): Payer: Self-pay

## 2023-04-25 ENCOUNTER — Ambulatory Visit: Payer: Medicare Other | Attending: Cardiovascular Disease

## 2023-04-25 DIAGNOSIS — Z0000 Encounter for general adult medical examination without abnormal findings: Secondary | ICD-10-CM

## 2023-04-25 NOTE — Telephone Encounter (Signed)
Called patient to hold session over the phone. Patient didn't answer first call. Called back and left a message for patient to return call.    Renaee Munda, MS, ERHD, [facility]  Care Guide, Health & Wellness Coach [address] [place] [address] Telephone: [phone] Email: [email or web address]

## 2023-05-10 NOTE — Progress Notes (Signed)
[address]       [place] [address]             [phone]                    [name] [facility] Medical Record #[id number] Date of Birth: Jul 29, 1956  Referring: Karren Burly, MD Primary Care: [facility] Primary Cardiologist: Marjo Bicker, MD  Chief Complaint:    Chief Complaint  Patient presents with   Lung Cancer    New patient consultation, CT super D 5/22, Bronch 6/18, PFTs 7/15     History of Present Illness:    Linda Mayer 67 y.o. female presents for surgical evaluation of a biopsy proven carcinoid tumors in the right upper and middle lobes.  She is asymptomatic from a pulmonary standpoint.  She denies any weight changes or neurologic symptoms.  She continues to smoke, but is willing to stop next week.       Past Medical History:  Diagnosis Date   AAA (abdominal aortic aneurysm) (HCC)    Anxiety    Complication of anesthesia    Pt states that her BP drops really low after anesthesia. Hard time waking up   Coronary artery disease    Depression    situational   Dizziness    GERD (gastroesophageal reflux disease)    Hypertension    Hypothyroidism    Peripheral vascular disease (HCC)     Past Surgical History:  Procedure Laterality Date   ABDOMINAL HYSTERECTOMY     BARTHOLIN CYST MARSUPIALIZATION     Removal   BRONCHIAL BIOPSY  03/20/2023   Procedure: BRONCHIAL BIOPSIES;  Surgeon: Leslye Peer, MD;  Location: [facility] ENDOSCOPY;  Service: Pulmonary;;   BRONCHIAL BRUSHINGS  03/20/2023   Procedure: BRONCHIAL BRUSHINGS;  Surgeon: Leslye Peer, MD;  Location: [facility] ENDOSCOPY;  Service: Pulmonary;;   BRONCHIAL NEEDLE ASPIRATION BIOPSY  03/20/2023   Procedure: BRONCHIAL NEEDLE ASPIRATION BIOPSIES;  Surgeon: Leslye Peer, MD;  Location: [facility] ENDOSCOPY;  Service: Pulmonary;;   BRONCHIAL WASHINGS  03/20/2023   Procedure: BRONCHIAL WASHINGS;  Surgeon: Leslye Peer, MD;  Location: [facility] ENDOSCOPY;   Service: Pulmonary;;   THYROIDECTOMY N/A 05/12/2016   Procedure: THYROIDECTOMY;  Surgeon: Franky Macho, MD;  Location: [facility] ORS;  Service: General;  Laterality: N/A;   VESICOVAGINAL FISTULA CLOSURE W/ TAH      Family History  Problem Relation Age of Onset   Heart disease Brother    Thyroid disease Brother    Hypotension Brother      Social History   Tobacco Use  Smoking Status Every Day   Current packs/day: 0.50   Average packs/day: 0.5 packs/day for 50.0 years (25.0 ttl pk-yrs)   Types: Cigarettes  Smokeless Tobacco Never  Tobacco Comments   Currently at 5 cigarettes per day.     Social History   Substance and Sexual Activity  Alcohol Use No     Allergies  Allergen Reactions   Zetia [Ezetimibe] Diarrhea   Zocor [Simvastatin]     Muscle pain     Current Outpatient Medications  Medication Sig Dispense Refill   ALPRAZolam (XANAX) 0.5 MG tablet Take 0.5 mg by mouth 3 (three) times daily.     aspirin 81 MG chewable tablet Chew 81 mg by mouth daily.     Calcium Carb-Cholecalciferol (CALCIUM 600 + D PO) Take 1 tablet by mouth 2 (two) times daily.  ciprofloxacin (CIPRO) 500 MG tablet Take 1 tablet (500 mg total) by mouth every 12 (twelve) hours. 14 tablet 0   levothyroxine (SYNTHROID) 88 MCG tablet Take 88 mcg by mouth daily.     lisinopril (PRINIVIL,ZESTRIL) 10 MG tablet Take 10 mg by mouth daily.     mirtazapine (REMERON) 15 MG tablet Take 15 mg by mouth at bedtime.     naproxen sodium (ALEVE) 220 MG tablet Take 440 mg by mouth 2 (two) times daily as needed (pain).     rosuvastatin (CRESTOR) 10 MG tablet Take 10 mg by mouth 2 (two) times a week.     Tiotropium Bromide-Olodaterol (STIOLTO RESPIMAT) 2.5-2.5 MCG/ACT AERS Inhale 2 puffs into the lungs daily. 8 g 0   VITAMIN D PO Take 1 capsule by mouth daily.     No current facility-administered medications for this visit.    Review of Systems  Constitutional:  Negative for fever and malaise/fatigue.  Respiratory:   Negative for cough and shortness of breath.   Cardiovascular:  Negative for chest pain.  Musculoskeletal: Negative.   Neurological: Negative.      PHYSICAL EXAMINATION: BP (!) 143/57 (BP Location: Left Arm, Patient Position: Sitting, Cuff Size: Normal)   Pulse (!) 55   Resp 20   Ht 5\' 5"  (1.651 m)   Wt 141 lb (64 kg)   SpO2 97% Comment: RA  BMI 23.46 kg/m  Physical Exam Constitutional:      General: She is not in acute distress. HENT:     Head: Normocephalic and atraumatic.  Eyes:     Extraocular Movements: Extraocular movements intact.  Cardiovascular:     Rate and Rhythm: Bradycardia present.  Pulmonary:     Effort: Pulmonary effort is normal. No respiratory distress.  Abdominal:     General: Abdomen is flat. There is no distension.  Musculoskeletal:        General: Normal range of motion.     Cervical back: Normal range of motion.  Skin:    General: Skin is warm and dry.  Neurological:     General: No focal deficit present.     Mental Status: She is alert and oriented to person, place, and time.          I have independently reviewed the above radiology studies  and reviewed the findings with the patient.   Recent Lab Findings: Lab Results  Component Value Date   WBC 7.2 03/20/2023   HGB 13.7 03/20/2023   HCT 43.0 03/20/2023   PLT 170 03/20/2023   GLUCOSE 106 (H) 03/20/2023   ALT 14 05/13/2016   AST 16 05/13/2016   NA 141 03/20/2023   K 4.6 03/20/2023   CL 106 03/20/2023   CREATININE 0.96 03/20/2023   BUN 21 03/20/2023   CO2 28 03/20/2023   TSH 0.01 (L) 03/09/2017    Diagnostic Studies & Laboratory data:     Recent Radiology Findings:   No results found.   PFTs:  - FVC: 79% - FEV1: 78% -DLCO: 109%  FINAL MICROSCOPIC DIAGNOSIS:  A.  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  RIGHT LUNG, MIDDLE LOBE, BRUSH:  - Malignant  - Well-differentiated  neuroendocrine tumor (grade 1)   C.  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  RIGHT LUNG, UPPER LOBE, BRUSH:  - Malignant  - Well-differentiated neuroendocrine tumor (  grade 1)   Assessment / Plan:   67yo female with RUL and RML biopsy proven carcinoid tumors.  She will require a Dotatate scan for completetion.  If clear for metastatic disease we will proceed with navigational bronchoscopy for marking of the two lesions followed by a R RATS, RUL and RML wedge resections.  The risks and benefits have been discussed, and she is agreeable to proceed.  She will require a stress test prior to surgery     I  spent 40 minutes with  the patient face to face in counseling and coordination of care.    Corliss Skains 05/11/2023 11:11 AM

## 2023-05-11 ENCOUNTER — Institutional Professional Consult (permissible substitution): Payer: Medicare Other | Admitting: Thoracic Surgery (Cardiothoracic Vascular Surgery)

## 2023-05-11 ENCOUNTER — Other Ambulatory Visit: Payer: Self-pay | Admitting: Thoracic Surgery (Cardiothoracic Vascular Surgery)

## 2023-05-11 ENCOUNTER — Other Ambulatory Visit: Payer: Self-pay | Admitting: *Deleted

## 2023-05-11 ENCOUNTER — Encounter: Payer: Self-pay | Admitting: Thoracic Surgery (Cardiothoracic Vascular Surgery)

## 2023-05-11 VITALS — BP 143/57 | HR 55 | Resp 20 | Ht 65.0 in | Wt 141.0 lb

## 2023-05-11 DIAGNOSIS — R918 Other nonspecific abnormal finding of lung field: Secondary | ICD-10-CM

## 2023-05-14 ENCOUNTER — Other Ambulatory Visit: Payer: Self-pay | Admitting: Emergency Medicine

## 2023-05-14 ENCOUNTER — Encounter (HOSPITAL_COMMUNITY): Payer: Self-pay | Admitting: Thoracic Surgery (Cardiothoracic Vascular Surgery)

## 2023-05-14 ENCOUNTER — Other Ambulatory Visit: Payer: Self-pay | Admitting: *Deleted

## 2023-05-14 ENCOUNTER — Encounter: Payer: Self-pay | Admitting: *Deleted

## 2023-05-14 ENCOUNTER — Other Ambulatory Visit: Payer: Self-pay | Admitting: Thoracic Surgery (Cardiothoracic Vascular Surgery)

## 2023-05-14 DIAGNOSIS — R918 Other nonspecific abnormal finding of lung field: Secondary | ICD-10-CM

## 2023-05-14 DIAGNOSIS — D3A090 Benign carcinoid tumor of the bronchus and lung: Secondary | ICD-10-CM

## 2023-05-14 NOTE — Progress Notes (Signed)
Orders to perform robotic assisted navigational bronchoscopy in conjunction with VATS wedge resection with Dr. Cliffton Asters

## 2023-05-15 ENCOUNTER — Telehealth (HOSPITAL_COMMUNITY): Payer: Self-pay | Admitting: *Deleted

## 2023-05-15 NOTE — Telephone Encounter (Signed)
Pt reached and given instructions for mpi study .

## 2023-05-17 ENCOUNTER — Ambulatory Visit (HOSPITAL_COMMUNITY): Payer: Medicare Other | Attending: Thoracic Surgery (Cardiothoracic Vascular Surgery)

## 2023-05-17 DIAGNOSIS — R918 Other nonspecific abnormal finding of lung field: Secondary | ICD-10-CM | POA: Insufficient documentation

## 2023-05-17 DIAGNOSIS — Z01810 Encounter for preprocedural cardiovascular examination: Secondary | ICD-10-CM | POA: Diagnosis not present

## 2023-05-17 LAB — MYOCARDIAL PERFUSION IMAGING
LV dias vol: 65 mL (ref 46–106)
LV sys vol: 21 mL
Nuc Stress EF: 67 %
Peak HR: 91 {beats}/min
Rest HR: 55 {beats}/min
Rest Nuclear Isotope Dose: 10.6 mCi
SDS: 2
SRS: 0
SSS: 2
ST Depression (mm): 0 mm
Stress Nuclear Isotope Dose: 31.9 mCi
TID: 1.04

## 2023-05-17 MED ORDER — REGADENOSON 0.4 MG/5ML IV SOLN
0.4000 mg | Freq: Once | INTRAVENOUS | Status: AC
  Administered 2023-05-17: 0.4 mg via INTRAVENOUS

## 2023-05-17 MED ORDER — TECHNETIUM TC 99M TETROFOSMIN IV KIT
31.9000 | PACK | Freq: Once | INTRAVENOUS | Status: AC | PRN
  Administered 2023-05-17: 31.9 via INTRAVENOUS

## 2023-05-17 MED ORDER — TECHNETIUM TC 99M TETROFOSMIN IV KIT
10.6000 | PACK | Freq: Once | INTRAVENOUS | Status: AC | PRN
  Administered 2023-05-17: 10.6 via INTRAVENOUS

## 2023-05-30 ENCOUNTER — Ambulatory Visit (HOSPITAL_COMMUNITY)
Admission: RE | Admit: 2023-05-30 | Discharge: 2023-05-30 | Disposition: A | Discharge status: Home / Self Care | Payer: Medicare Other | Source: Ambulatory Visit | Attending: Thoracic Surgery (Cardiothoracic Vascular Surgery) | Admitting: Thoracic Surgery (Cardiothoracic Vascular Surgery)

## 2023-05-30 DIAGNOSIS — C7A8 Other malignant neuroendocrine tumors: Secondary | ICD-10-CM | POA: Diagnosis not present

## 2023-05-30 DIAGNOSIS — R918 Other nonspecific abnormal finding of lung field: Secondary | ICD-10-CM | POA: Insufficient documentation

## 2023-05-30 MED ORDER — COPPER CU 64 DOTATATE 1 MCI/ML IV SOLN
4.0000 | Freq: Once | INTRAVENOUS | Status: AC
  Administered 2023-05-30: 4.11 via INTRAVENOUS

## 2023-06-06 ENCOUNTER — Telehealth: Payer: Self-pay | Admitting: Emergency Medicine

## 2023-06-06 NOTE — Telephone Encounter (Signed)
Surgical orders from DR.Byrum. She is coming in tomorrow (Thursday 9/5)for Pre-Op.

## 2023-06-06 NOTE — Telephone Encounter (Signed)
I have sent a secure chat to Dr. Delton Coombes to place surgical orders for this patient.  I notified [place].

## 2023-06-06 NOTE — Pre-Procedure Instructions (Signed)
Surgical Instructions   Your procedure is scheduled on Monday, September 9th. Report to [facility] Main Entrance "A" at 05:30 A.M., then check in with the Admitting office. Any questions or running late day of surgery: call [phone]  Questions prior to your surgery date: call [phone], Monday-Friday, 8am-4pm. If you experience any cold or flu symptoms such as cough, fever, chills, shortness of breath, etc. between now and your scheduled surgery, please notify us at the above number.     Remember:  Do not eat or drink after midnight the night before your surgery    Take these medicines the morning of surgery with A SIP OF WATER  ALPRAZolam (XANAX)  levothyroxine (SYNTHROID)  rosuvastatin (CRESTOR)    May take these medicines IF NEEDED: Tiotropium Bromide-Olodaterol (STIOLTO RESPIMAT)    One week prior to surgery, STOP taking any Aspirin (unless otherwise instructed by your surgeon) Aleve, Naproxen, Ibuprofen, Motrin, Advil, Goody's, BC's, all herbal medications, fish oil, and non-prescription vitamins.                     Do NOT Smoke (Tobacco/Vaping) for 24 hours prior to your procedure.  If you use a CPAP at night, you may bring your mask/headgear for your overnight stay.   You will be asked to remove any contacts, glasses, piercing's, hearing aid's, dentures/partials prior to surgery. Please bring cases for these items if needed.    Patients discharged the day of surgery will not be allowed to drive home, and someone needs to stay with them for 24 hours.  SURGICAL WAITING ROOM VISITATION Patients may have no more than 2 support people in the waiting area - these visitors may rotate.   Pre-op nurse will coordinate an appropriate time for 1 ADULT support person, who may not rotate, to accompany patient in pre-op.  Children under the age of 32 must have an adult with them who is not the patient and must remain in the main waiting area with an adult.  If the patient  needs to stay at the hospital during part of their recovery, the visitor guidelines for inpatient rooms apply.  Please refer to the [facility] website for the visitor guidelines for any additional information.   If you received a COVID test during your pre-op visit  it is requested that you wear a mask when out in public, stay away from anyone that may not be feeling well and notify your surgeon if you develop symptoms. If you have been in contact with anyone that has tested positive in the last 10 days please notify you surgeon.      Pre-operative CHG Bathing Instructions   You can play a key role in reducing the risk of infection after surgery. Your skin needs to be as free of germs as possible. You can reduce the number of germs on your skin by washing with CHG (chlorhexidine gluconate) soap before surgery. CHG is an antiseptic soap that kills germs and continues to kill germs even after washing.   DO NOT use if you have an allergy to chlorhexidine/CHG or antibacterial soaps. If your skin becomes reddened or irritated, stop using the CHG and notify one of our RNs at [phone].              TAKE A SHOWER THE NIGHT BEFORE SURGERY AND THE DAY OF SURGERY    Please keep in mind the following:  DO NOT shave, including legs and underarms, 48 hours prior to surgery.  You may shave your face before/day of surgery.  Place clean sheets on your bed the night before surgery Use a clean washcloth (not used since being washed) for each shower. DO NOT sleep with pet's night before surgery.  CHG Shower Instructions:  If you choose to wash your hair and private area, wash first with your normal shampoo/soap.  After you use shampoo/soap, rinse your hair and body thoroughly to remove shampoo/soap residue.  Turn the water OFF and apply half the bottle of CHG soap to a CLEAN washcloth.  Apply CHG soap ONLY FROM YOUR NECK DOWN TO YOUR TOES (washing for 3-5 minutes)  DO NOT use CHG soap on face,  private areas, open wounds, or sores.  Pay special attention to the area where your surgery is being performed.  If you are having back surgery, having someone wash your back for you may be helpful. Wait 2 minutes after CHG soap is applied, then you may rinse off the CHG soap.  Pat dry with a clean towel  Put on clean pajamas    Additional instructions for the day of surgery: DO NOT APPLY any lotions, deodorants, cologne, or perfumes.   Do not wear jewelry or makeup Do not wear nail polish, gel polish, artificial nails, or any other type of covering on natural nails (fingers and toes) Do not bring valuables to the hospital. [facility] is not responsible for valuables/personal belongings. Put on clean/comfortable clothes.  Please brush your teeth.  Ask your nurse before applying any prescription medications to the skin.

## 2023-06-07 ENCOUNTER — Ambulatory Visit (HOSPITAL_COMMUNITY)
Admission: RE | Admit: 2023-06-07 | Discharge: 2023-06-07 | Disposition: A | Discharge status: Home / Self Care | Payer: PRIVATE HEALTH INSURANCE | Source: Ambulatory Visit | Attending: Thoracic Surgery (Cardiothoracic Vascular Surgery) | Admitting: Thoracic Surgery (Cardiothoracic Vascular Surgery)

## 2023-06-07 ENCOUNTER — Other Ambulatory Visit: Payer: Self-pay

## 2023-06-07 ENCOUNTER — Encounter (HOSPITAL_COMMUNITY): Payer: Self-pay

## 2023-06-07 ENCOUNTER — Encounter (HOSPITAL_COMMUNITY)
Admission: RE | Admit: 2023-06-07 | Discharge: 2023-06-07 | Disposition: A | Discharge status: Home / Self Care | Payer: Medicare Other | Source: Ambulatory Visit | Attending: Thoracic Surgery (Cardiothoracic Vascular Surgery) | Admitting: Thoracic Surgery (Cardiothoracic Vascular Surgery)

## 2023-06-07 VITALS — BP 142/63 | HR 62 | Temp 98.3°F | Resp 18 | Ht 65.0 in | Wt 143.3 lb

## 2023-06-07 DIAGNOSIS — R918 Other nonspecific abnormal finding of lung field: Secondary | ICD-10-CM | POA: Diagnosis not present

## 2023-06-07 DIAGNOSIS — Z01812 Encounter for preprocedural laboratory examination: Secondary | ICD-10-CM | POA: Diagnosis not present

## 2023-06-07 DIAGNOSIS — R911 Solitary pulmonary nodule: Secondary | ICD-10-CM | POA: Diagnosis not present

## 2023-06-07 DIAGNOSIS — Z01811 Encounter for preprocedural respiratory examination: Secondary | ICD-10-CM | POA: Insufficient documentation

## 2023-06-07 DIAGNOSIS — R9431 Abnormal electrocardiogram [ECG] [EKG]: Secondary | ICD-10-CM | POA: Diagnosis not present

## 2023-06-07 DIAGNOSIS — C7A090 Malignant carcinoid tumor of the bronchus and lung: Secondary | ICD-10-CM | POA: Insufficient documentation

## 2023-06-07 DIAGNOSIS — K219 Gastro-esophageal reflux disease without esophagitis: Secondary | ICD-10-CM | POA: Insufficient documentation

## 2023-06-07 DIAGNOSIS — Z01810 Encounter for preprocedural cardiovascular examination: Secondary | ICD-10-CM | POA: Insufficient documentation

## 2023-06-07 DIAGNOSIS — I4519 Other right bundle-branch block: Secondary | ICD-10-CM | POA: Insufficient documentation

## 2023-06-07 DIAGNOSIS — D3A090 Benign carcinoid tumor of the bronchus and lung: Secondary | ICD-10-CM

## 2023-06-07 DIAGNOSIS — I10 Essential (primary) hypertension: Secondary | ICD-10-CM | POA: Diagnosis not present

## 2023-06-07 DIAGNOSIS — Z01818 Encounter for other preprocedural examination: Secondary | ICD-10-CM

## 2023-06-07 DIAGNOSIS — I252 Old myocardial infarction: Secondary | ICD-10-CM | POA: Insufficient documentation

## 2023-06-07 DIAGNOSIS — R001 Bradycardia, unspecified: Secondary | ICD-10-CM | POA: Insufficient documentation

## 2023-06-07 DIAGNOSIS — Z1152 Encounter for screening for COVID-19: Secondary | ICD-10-CM | POA: Insufficient documentation

## 2023-06-07 HISTORY — DX: Benign carcinoid tumor of the bronchus and lung: D3A.090

## 2023-06-07 HISTORY — DX: Headache, unspecified: R51.9

## 2023-06-07 LAB — URINALYSIS, ROUTINE W REFLEX MICROSCOPIC
Bilirubin Urine: NEGATIVE
Glucose, UA: NEGATIVE mg/dL
Ketones, ur: NEGATIVE mg/dL
Nitrite: POSITIVE — AB
Protein, ur: NEGATIVE mg/dL
Specific Gravity, Urine: 1.016 (ref 1.005–1.030)
pH: 5 (ref 5.0–8.0)

## 2023-06-07 LAB — CBC
HCT: 40.8 % (ref 36.0–46.0)
Hemoglobin: 13.7 g/dL (ref 12.0–15.0)
MCH: 31.4 pg (ref 26.0–34.0)
MCHC: 33.6 g/dL (ref 30.0–36.0)
MCV: 93.4 fL (ref 80.0–100.0)
Platelets: 159 10*3/uL (ref 150–400)
RBC: 4.37 MIL/uL (ref 3.87–5.11)
RDW: 12.4 % (ref 11.5–15.5)
WBC: 7.1 10*3/uL (ref 4.0–10.5)
nRBC: 0 % (ref 0.0–0.2)

## 2023-06-07 LAB — PROTIME-INR
INR: 0.8 (ref 0.8–1.2)
Prothrombin Time: 11.7 s (ref 11.4–15.2)

## 2023-06-07 LAB — APTT: aPTT: 34 s (ref 24–36)

## 2023-06-07 LAB — SURGICAL PCR SCREEN
MRSA, PCR: NEGATIVE
Staphylococcus aureus: NEGATIVE

## 2023-06-07 NOTE — Progress Notes (Addendum)
CMP drawn at PAT hemolyzed, per lab. This will need to be re-collected on day of surgery. Allyssa K. notified.   Also notified her about pt's abnormal UA

## 2023-06-07 NOTE — Progress Notes (Signed)
PCP - Dr. Elfredia Nevins Cardiologist - Dr. Luane School  PPM/ICD - denies   Chest x-ray - 06/07/23 EKG - 06/07/23 Stress Test - 05/17/23 ECHO - denies Cardiac Cath - denies  Sleep Study - denies   DM- denies  Blood Thinner Instructions: n/a Aspirin Instructions: hold DOS  ERAS Protcol - no, NPO   COVID TEST- 06/07/23   Anesthesia review: yes, cardiac hx  Patient denies shortness of breath, fever, cough and chest pain at PAT appointment   All instructions explained to the patient, with a verbal understanding of the material. Patient agrees to go over the instructions while at home for a better understanding. Patient also instructed to wear a mask in public after being tested for COVID-19. The opportunity to ask questions was provided.

## 2023-06-08 ENCOUNTER — Other Ambulatory Visit (HOSPITAL_COMMUNITY): Payer: Medicare Other

## 2023-06-08 ENCOUNTER — Other Ambulatory Visit: Payer: Self-pay | Admitting: *Deleted

## 2023-06-08 LAB — SARS CORONAVIRUS 2 (TAT 6-24 HRS): SARS Coronavirus 2: NEGATIVE

## 2023-06-08 MED ORDER — CIPROFLOXACIN HCL 500 MG PO TABS: 500.0000 mg | ORAL_TABLET | Freq: Two times a day (BID) | ORAL | 0 refills | Status: DC

## 2023-06-08 NOTE — Anesthesia Preprocedure Evaluation (Signed)
Anesthesia Evaluation  Patient identified by MRN, date of birth, ID band Patient awake    Reviewed: Allergy & Precautions, NPO status , Patient's Chart, lab work & pertinent test results  Airway Mallampati: II  TM Distance: >3 FB Neck ROM: Full    Dental  (+) Dental Advisory Given, Partial Lower   Pulmonary Patient abstained from smoking., former smoker   Pulmonary exam normal breath sounds clear to auscultation       Cardiovascular hypertension, Pt. on medications + CAD and + Peripheral Vascular Disease  Normal cardiovascular exam Rhythm:Regular Rate:Normal     Neuro/Psych  Headaches PSYCHIATRIC DISORDERS Anxiety Depression       GI/Hepatic Neg liver ROS,GERD  ,,  Endo/Other  Hypothyroidism    Renal/GU negative Renal ROS     Musculoskeletal negative musculoskeletal ROS (+)    Abdominal   Peds  Hematology negative hematology ROS (+)   Anesthesia Other Findings lung nodule  Reproductive/Obstetrics                             Anesthesia Physical Anesthesia Plan  ASA: 3  Anesthesia Plan: General   Post-op Pain Management: Tylenol PO (pre-op)*   Induction: Intravenous  PONV Risk Score and Plan: 3 and Ondansetron, Propofol infusion, Dexamethasone, Midazolam and Treatment may vary due to age or medical condition  Airway Management Planned: Oral ETT  Additional Equipment: Arterial line  Intra-op Plan:   Post-operative Plan: Possible Post-op intubation/ventilation  Informed Consent: I have reviewed the patients History and Physical, chart, labs and discussed the procedure including the risks, benefits and alternatives for the proposed anesthesia with the patient or authorized representative who has indicated his/her understanding and acceptance.     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  Nuclear stress 05/17/2023 was low risk, no evidence of ischemia or infarction.  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  Follows with vascular surgery for monitoring of infrarenal abdominal aortic aneurysm measuring 31 mm and bilateral 60 to 79% ICA stenosis which is asymptomatic.  Last seen by Dr. Lenell Antu 04/10/2023.  Stable at that time, recommended repeat carotid duplex in 6 months and repeat AAA duplex in 3 years.  Labs reviewed, CBC WNL.  CMP hemolyzed and will need to be redrawn on day of surgery.  Abnormal UA, surgeons office notified.  EKG 06/07/2023: Sinus bradycardia with sinus rhythm.  Rate 57.  Incomplete right bundle-branch block.  Septal infarct, age uncertain.  Nuclear stress 05/17/2023:   The study is normal. The study is low risk.   No ST deviation was noted.   LV perfusion is normal. There is no evidence of ischemia. There is no evidence of infarction.   Left ventricular function is normal. Nuclear stress EF: 67%. The left ventricular ejection fraction is hyperdynamic (>65%). End diastolic cavity size is normal. End systolic cavity size is normal.   Prior study not available for comparison.  Normal resting and stress perfusion. No ischemia or infarction EF 67%  Carotid duplex 04/10/2023: Summary:  Right Carotid: Velocities in the right ICA are consistent with a 60-79%  stenosis.   Left Carotid: Velocities in  the left ICA are consistent with a 60-79% stenosis.   Vertebrals:Bilateral vertebral arteries demonstrate antegrade flow.  Subclavians: Normal flow hemodynamics were seen in bilateral subclavian arteries.    )        Anesthesia Quick Evaluation

## 2023-06-08 NOTE — Progress Notes (Signed)
UA results from PAT reviewed by M. Roddenberry, PA. Cipro sent to patient's preferred pharmacy. Patient aware. States back has been hurting for a few days. Patient appreciative of call.

## 2023-06-08 NOTE — Progress Notes (Signed)
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  Nuclear stress 05/17/2023 was low risk, no evidence of ischemia or infarction.  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  Follows with vascular surgery for monitoring of infrarenal abdominal aortic aneurysm measuring 31 mm and bilateral 60 to 79% ICA stenosis which is asymptomatic.  Last seen by Dr. Lenell Antu 04/10/2023.  Stable at that time, recommended repeat carotid duplex in 6 months and repeat AAA duplex in 3 years.  Labs reviewed, CBC WNL.  CMP hemolyzed and will need to be redrawn on day of surgery.  Abnormal UA, surgeons office notified.  EKG 06/07/2023: Sinus bradycardia with sinus rhythm.  Rate 57.  Incomplete right bundle-branch block.  Septal infarct, age uncertain.  Nuclear stress 05/17/2023:   The study is normal. The study is low risk.   No ST deviation was noted.   LV perfusion is normal. There is no evidence of ischemia. There is no evidence of infarction.   Left ventricular function is normal. Nuclear stress EF: 67%. The left ventricular ejection fraction is hyperdynamic (>65%). End diastolic cavity size is normal. End systolic cavity size is normal.   Prior study not available for comparison.   Normal resting and stress perfusion. No ischemia or infarction EF 67%  Carotid duplex 04/10/2023: Summary:  Right Carotid: Velocities in the right ICA are consistent with a 60-79%  stenosis.   Left Carotid: Velocities in the left ICA are consistent with a 60-79% stenosis.   Vertebrals: Bilateral vertebral arteries demonstrate antegrade flow.  Subclavians: Normal flow hemodynamics were seen in bilateral subclavian arteries.     Zannie Cove [facility] Short Stay  Center/Anesthesiology Phone [phone] 06/08/2023 10:56 AM

## 2023-06-10 ENCOUNTER — Encounter (HOSPITAL_COMMUNITY): Payer: Self-pay | Admitting: Emergency Medicine

## 2023-06-10 NOTE — Anesthesia Preprocedure Evaluation (Signed)
Anesthesia Evaluation  Patient identified by MRN, date of birth, ID band Patient awake    Reviewed: Allergy & Precautions, NPO status , Patient's Chart, lab work & pertinent test results  History of Anesthesia Complications (+) history of anesthetic complications  Airway Mallampati: II  TM Distance: >3 FB Neck ROM: Full    Dental  (+) Dental Advisory Given, Partial Lower   Pulmonary Patient abstained from smoking., former smoker   Pulmonary exam normal breath sounds clear to auscultation       Cardiovascular hypertension, Pt. on medications + CAD and + Peripheral Vascular Disease  Normal cardiovascular exam Rhythm:Regular Rate:Normal     Neuro/Psych  Headaches PSYCHIATRIC DISORDERS Anxiety Depression       GI/Hepatic Neg liver ROS,GERD  ,,  Endo/Other  Hypothyroidism    Renal/GU negative Renal ROS     Musculoskeletal negative musculoskeletal ROS (+)    Abdominal   Peds  Hematology negative hematology ROS (+)   Anesthesia Other Findings lung nodule  Reproductive/Obstetrics                              Anesthesia Physical Anesthesia Plan  ASA: 3  Anesthesia Plan: General   Post-op Pain Management: Tylenol PO (pre-op)*   Induction: Intravenous  PONV Risk Score and Plan: 4 or greater and Ondansetron, Dexamethasone and Treatment may vary due to age or medical condition  Airway Management Planned: Double Lumen EBT  Additional Equipment: Arterial line  Intra-op Plan:   Post-operative Plan: Possible Post-op intubation/ventilation  Informed Consent: I have reviewed the patients History and Physical, chart, labs and discussed the procedure including the risks, benefits and alternatives for the proposed anesthesia with the patient or authorized representative who has indicated his/her understanding and acceptance.     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  Nuclear stress 05/17/2023 was low risk, no evidence of ischemia or infarction.  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  Follows with vascular surgery for monitoring of infrarenal abdominal aortic aneurysm measuring 31 mm and bilateral 60 to 79% ICA stenosis which is asymptomatic.  Last seen by Dr. Lenell Antu 04/10/2023.  Stable at that time, recommended repeat carotid duplex in 6 months and repeat AAA duplex in 3 years.  Labs reviewed, CBC WNL.  CMP hemolyzed and will need to be redrawn on day of surgery.  Abnormal UA, surgeons office notified.  EKG 06/07/2023: Sinus bradycardia with sinus rhythm.  Rate 57.  Incomplete right bundle-branch block.  Septal infarct, age uncertain.  Nuclear stress 05/17/2023:   The study is normal. The study is low risk.   No ST deviation was noted.   LV perfusion is normal. There is no evidence of ischemia. There is no evidence of infarction.   Left ventricular function is normal. Nuclear stress EF: 67%. The left ventricular ejection fraction is hyperdynamic (>65%). End diastolic cavity size is normal. End systolic cavity size is normal.   Prior study not available for comparison.   Normal resting and stress perfusion. No ischemia or infarction EF 67%  Carotid duplex 04/10/2023: Summary:  Right Carotid: Velocities in the right ICA are consistent with a 60-79%  stenosis.  Left Carotid: Velocities in the left ICA are consistent with a 60-79% stenosis.   Vertebrals: Bilateral vertebral arteries demonstrate antegrade flow.  Subclavians: Normal flow hemodynamics were seen in bilateral subclavian arteries.    )          Anesthesia Quick Evaluation

## 2023-06-11 ENCOUNTER — Inpatient Hospital Stay (HOSPITAL_COMMUNITY)
Admission: RE | Admit: 2023-06-11 | Discharge: 2023-06-12 | DRG: 165 | Disposition: A | Discharge status: Home / Self Care | Payer: Medicare Other | Attending: Thoracic Surgery (Cardiothoracic Vascular Surgery) | Admitting: Thoracic Surgery (Cardiothoracic Vascular Surgery)

## 2023-06-11 ENCOUNTER — Encounter (HOSPITAL_COMMUNITY): Payer: Self-pay | Admitting: Emergency Medicine

## 2023-06-11 ENCOUNTER — Ambulatory Visit (HOSPITAL_COMMUNITY): Payer: Medicare Other | Admitting: Anesthesiology

## 2023-06-11 ENCOUNTER — Ambulatory Visit (HOSPITAL_COMMUNITY): Payer: Medicare Other

## 2023-06-11 ENCOUNTER — Encounter (HOSPITAL_COMMUNITY)
Admission: RE | Disposition: A | Discharge status: Home / Self Care | Payer: Self-pay | Source: Home / Self Care | Attending: Thoracic Surgery (Cardiothoracic Vascular Surgery)

## 2023-06-11 ENCOUNTER — Inpatient Hospital Stay (HOSPITAL_COMMUNITY): Payer: Medicare Other

## 2023-06-11 ENCOUNTER — Other Ambulatory Visit: Payer: Self-pay

## 2023-06-11 ENCOUNTER — Ambulatory Visit (HOSPITAL_COMMUNITY): Payer: Medicare Other | Admitting: Physician Assistant

## 2023-06-11 DIAGNOSIS — Z79890 Hormone replacement therapy (postmenopausal): Secondary | ICD-10-CM

## 2023-06-11 DIAGNOSIS — F419 Anxiety disorder, unspecified: Secondary | ICD-10-CM | POA: Diagnosis present

## 2023-06-11 DIAGNOSIS — I739 Peripheral vascular disease, unspecified: Secondary | ICD-10-CM | POA: Diagnosis present

## 2023-06-11 DIAGNOSIS — Z48813 Encounter for surgical aftercare following surgery on the respiratory system: Secondary | ICD-10-CM | POA: Diagnosis not present

## 2023-06-11 DIAGNOSIS — J939 Pneumothorax, unspecified: Secondary | ICD-10-CM | POA: Diagnosis not present

## 2023-06-11 DIAGNOSIS — E039 Hypothyroidism, unspecified: Secondary | ICD-10-CM

## 2023-06-11 DIAGNOSIS — I2510 Atherosclerotic heart disease of native coronary artery without angina pectoris: Secondary | ICD-10-CM | POA: Diagnosis not present

## 2023-06-11 DIAGNOSIS — R911 Solitary pulmonary nodule: Secondary | ICD-10-CM | POA: Diagnosis not present

## 2023-06-11 DIAGNOSIS — Z87891 Personal history of nicotine dependence: Secondary | ICD-10-CM | POA: Diagnosis not present

## 2023-06-11 DIAGNOSIS — I10 Essential (primary) hypertension: Secondary | ICD-10-CM | POA: Diagnosis not present

## 2023-06-11 DIAGNOSIS — F17210 Nicotine dependence, cigarettes, uncomplicated: Secondary | ICD-10-CM | POA: Diagnosis not present

## 2023-06-11 DIAGNOSIS — F32A Depression, unspecified: Secondary | ICD-10-CM | POA: Diagnosis present

## 2023-06-11 DIAGNOSIS — Z79899 Other long term (current) drug therapy: Secondary | ICD-10-CM | POA: Diagnosis not present

## 2023-06-11 DIAGNOSIS — Z8249 Family history of ischemic heart disease and other diseases of the circulatory system: Secondary | ICD-10-CM

## 2023-06-11 DIAGNOSIS — D3A00 Benign carcinoid tumor of unspecified site: Secondary | ICD-10-CM | POA: Diagnosis not present

## 2023-06-11 DIAGNOSIS — Z01818 Encounter for other preprocedural examination: Secondary | ICD-10-CM

## 2023-06-11 DIAGNOSIS — K219 Gastro-esophageal reflux disease without esophagitis: Secondary | ICD-10-CM | POA: Diagnosis not present

## 2023-06-11 DIAGNOSIS — Z7982 Long term (current) use of aspirin: Secondary | ICD-10-CM

## 2023-06-11 DIAGNOSIS — C7A090 Malignant carcinoid tumor of the bronchus and lung: Secondary | ICD-10-CM

## 2023-06-11 DIAGNOSIS — Z4682 Encounter for fitting and adjustment of non-vascular catheter: Secondary | ICD-10-CM | POA: Diagnosis not present

## 2023-06-11 DIAGNOSIS — C3411 Malignant neoplasm of upper lobe, right bronchus or lung: Secondary | ICD-10-CM | POA: Diagnosis not present

## 2023-06-11 DIAGNOSIS — Z8349 Family history of other endocrine, nutritional and metabolic diseases: Secondary | ICD-10-CM

## 2023-06-11 DIAGNOSIS — Z888 Allergy status to other drugs, medicaments and biological substances status: Secondary | ICD-10-CM

## 2023-06-11 DIAGNOSIS — Z9889 Other specified postprocedural states: Secondary | ICD-10-CM | POA: Diagnosis not present

## 2023-06-11 DIAGNOSIS — D3A090 Benign carcinoid tumor of the bronchus and lung: Secondary | ICD-10-CM

## 2023-06-11 DIAGNOSIS — Z902 Acquired absence of lung [part of]: Secondary | ICD-10-CM | POA: Diagnosis present

## 2023-06-11 DIAGNOSIS — D491 Neoplasm of unspecified behavior of respiratory system: Secondary | ICD-10-CM | POA: Diagnosis not present

## 2023-06-11 DIAGNOSIS — R918 Other nonspecific abnormal finding of lung field: Principal | ICD-10-CM | POA: Diagnosis present

## 2023-06-11 DIAGNOSIS — E785 Hyperlipidemia, unspecified: Secondary | ICD-10-CM | POA: Diagnosis not present

## 2023-06-11 DIAGNOSIS — C342 Malignant neoplasm of middle lobe, bronchus or lung: Secondary | ICD-10-CM | POA: Diagnosis not present

## 2023-06-11 HISTORY — PX: NODE DISSECTION: SHX5269

## 2023-06-11 HISTORY — PX: INTERCOSTAL NERVE BLOCK: SHX5021

## 2023-06-11 HISTORY — PX: FIDUCIAL MARKER PLACEMENT: SHX6858

## 2023-06-11 LAB — COMPREHENSIVE METABOLIC PANEL
ALT: 12 U/L (ref 0–44)
AST: 16 U/L (ref 15–41)
Albumin: 3.1 g/dL — ABNORMAL LOW (ref 3.5–5.0)
Alkaline Phosphatase: 52 U/L (ref 38–126)
Anion gap: 6 (ref 5–15)
BUN: 16 mg/dL (ref 8–23)
CO2: 22 mmol/L (ref 22–32)
Calcium: 7.9 mg/dL — ABNORMAL LOW (ref 8.9–10.3)
Chloride: 110 mmol/L (ref 98–111)
Creatinine, Ser: 0.82 mg/dL (ref 0.44–1.00)
GFR, Estimated: >60 mL/min (ref >60)
Glucose, Bld: 104 mg/dL — ABNORMAL HIGH (ref 70–99)
Potassium: 4.2 mmol/L (ref 3.5–5.1)
Sodium: 138 mmol/L (ref 135–145)
Total Bilirubin: 0.7 mg/dL (ref 0.3–1.2)
Total Protein: 5.4 g/dL — ABNORMAL LOW (ref 6.5–8.1)

## 2023-06-11 LAB — MRSA NEXT GEN BY PCR, NASAL: MRSA by PCR Next Gen: NOT DETECTED

## 2023-06-11 LAB — PREPARE RBC (CROSSMATCH)

## 2023-06-11 SURGERY — WEDGE RESECTION, LUNG, ROBOT-ASSISTED, THORACOSCOPIC
Anesthesia: General | Site: Chest | Laterality: Right

## 2023-06-11 SURGERY — BRONCHOSCOPY, WITH BIOPSY USING ELECTROMAGNETIC NAVIGATION
Anesthesia: General

## 2023-06-11 MED ORDER — FENTANYL CITRATE (PF) 250 MCG/5ML IJ SOLN
INTRAMUSCULAR | Status: AC
  Filled 2023-06-11: qty 5

## 2023-06-11 MED ORDER — ASPIRIN 81 MG PO CHEW
81.0000 mg | CHEWABLE_TABLET | Freq: Every day | ORAL | Status: DC
  Administered 2023-06-12: 81 mg via ORAL
  Filled 2023-06-11: qty 1

## 2023-06-11 MED ORDER — FENTANYL CITRATE (PF) 250 MCG/5ML IJ SOLN
INTRAMUSCULAR | Status: DC | PRN
  Administered 2023-06-11: 100 ug via INTRAVENOUS

## 2023-06-11 MED ORDER — SENNOSIDES-DOCUSATE SODIUM 8.6-50 MG PO TABS
1.0000 | ORAL_TABLET | Freq: Every day | ORAL | Status: DC
  Filled 2023-06-11: qty 1

## 2023-06-11 MED ORDER — SUGAMMADEX SODIUM 200 MG/2ML IV SOLN
INTRAVENOUS | Status: DC | PRN
  Administered 2023-06-11: 129.8 mg via INTRAVENOUS

## 2023-06-11 MED ORDER — BUPIVACAINE LIPOSOME 1.3 % IJ SUSP
INTRAMUSCULAR | Status: AC
  Filled 2023-06-11: qty 20

## 2023-06-11 MED ORDER — ROCURONIUM BROMIDE 10 MG/ML (PF) SYRINGE
PREFILLED_SYRINGE | INTRAVENOUS | Status: AC
  Filled 2023-06-11: qty 10

## 2023-06-11 MED ORDER — MORPHINE SULFATE (PF) 2 MG/ML IV SOLN: 2.0000 mg | INTRAVENOUS | Status: DC | PRN

## 2023-06-11 MED ORDER — DEXAMETHASONE SODIUM PHOSPHATE 10 MG/ML IJ SOLN
INTRAMUSCULAR | Status: DC | PRN
  Administered 2023-06-11: 10 mg via INTRAVENOUS

## 2023-06-11 MED ORDER — PROPOFOL 10 MG/ML IV BOLUS
INTRAVENOUS | Status: DC | PRN
  Administered 2023-06-11: 50 mg via INTRAVENOUS
  Administered 2023-06-11: 40 mg via INTRAVENOUS

## 2023-06-11 MED ORDER — ONDANSETRON HCL 4 MG/2ML IJ SOLN
4.0000 mg | Freq: Four times a day (QID) | INTRAMUSCULAR | Status: DC | PRN
  Administered 2023-06-11: 4 mg via INTRAVENOUS
  Filled 2023-06-11: qty 2

## 2023-06-11 MED ORDER — LIDOCAINE 2% (20 MG/ML) 5 ML SYRINGE
INTRAMUSCULAR | Status: AC
  Filled 2023-06-11: qty 5

## 2023-06-11 MED ORDER — MIDAZOLAM HCL 2 MG/2ML IJ SOLN
INTRAMUSCULAR | Status: DC | PRN
  Administered 2023-06-11: 2 mg via INTRAVENOUS

## 2023-06-11 MED ORDER — DEXAMETHASONE SODIUM PHOSPHATE 10 MG/ML IJ SOLN
INTRAMUSCULAR | Status: AC
  Filled 2023-06-11: qty 1

## 2023-06-11 MED ORDER — ARFORMOTEROL TARTRATE 15 MCG/2ML IN NEBU
15.0000 ug | INHALATION_SOLUTION | Freq: Two times a day (BID) | RESPIRATORY_TRACT | Status: DC
  Administered 2023-06-11 – 2023-06-12 (×2): 15 ug via RESPIRATORY_TRACT
  Filled 2023-06-11 (×2): qty 2

## 2023-06-11 MED ORDER — ACETAMINOPHEN 160 MG/5ML PO SOLN: 1000.0000 mg | Freq: Four times a day (QID) | ORAL | Status: DC

## 2023-06-11 MED ORDER — LUNG SURGERY BOOK
Freq: Once | Status: AC
  Filled 2023-06-11: qty 1

## 2023-06-11 MED ORDER — SODIUM CHLORIDE FLUSH 0.9 % IV SOLN: INTRAVENOUS | Status: DC | PRN

## 2023-06-11 MED ORDER — FENTANYL CITRATE (PF) 250 MCG/5ML IJ SOLN
INTRAMUSCULAR | Status: DC | PRN
  Administered 2023-06-11: 50 ug via INTRAVENOUS

## 2023-06-11 MED ORDER — UMECLIDINIUM BROMIDE 62.5 MCG/ACT IN AEPB
1.0000 | INHALATION_SPRAY | Freq: Every day | RESPIRATORY_TRACT | Status: DC
  Filled 2023-06-11: qty 7

## 2023-06-11 MED ORDER — ROCURONIUM BROMIDE 10 MG/ML (PF) SYRINGE
PREFILLED_SYRINGE | INTRAVENOUS | Status: DC | PRN
  Administered 2023-06-11: 30 mg via INTRAVENOUS

## 2023-06-11 MED ORDER — MIRTAZAPINE 7.5 MG PO TABS
15.0000 mg | ORAL_TABLET | Freq: Every day | ORAL | Status: DC
  Administered 2023-06-11: 15 mg via ORAL
  Filled 2023-06-11: qty 2

## 2023-06-11 MED ORDER — SODIUM CHLORIDE 0.9% IV SOLUTION: Freq: Once | INTRAVENOUS | Status: DC

## 2023-06-11 MED ORDER — TRAMADOL HCL 50 MG PO TABS: 50.0000 mg | ORAL_TABLET | Freq: Four times a day (QID) | ORAL | Status: DC | PRN

## 2023-06-11 MED ORDER — DROPERIDOL 2.5 MG/ML IJ SOLN: 0.6250 mg | Freq: Once | INTRAMUSCULAR | Status: DC | PRN

## 2023-06-11 MED ORDER — PROPOFOL 500 MG/50ML IV EMUL
INTRAVENOUS | Status: DC | PRN
Start: 2023-06-11 — End: 2023-06-11
  Administered 2023-06-11: 100 ug/kg/min via INTRAVENOUS

## 2023-06-11 MED ORDER — ORAL CARE MOUTH RINSE: 15.0000 mL | Freq: Once | OROMUCOSAL | Status: AC

## 2023-06-11 MED ORDER — CEFAZOLIN SODIUM-DEXTROSE 2-4 GM/100ML-% IV SOLN
2.0000 g | Freq: Three times a day (TID) | INTRAVENOUS | Status: AC
  Administered 2023-06-11 – 2023-06-12 (×2): 2 g via INTRAVENOUS
  Filled 2023-06-11 (×2): qty 100

## 2023-06-11 MED ORDER — SODIUM CHLORIDE (PF) 0.9 % IJ SOLN
INTRAMUSCULAR | Status: AC
  Filled 2023-06-11: qty 10

## 2023-06-11 MED ORDER — ONDANSETRON HCL 4 MG/2ML IJ SOLN
INTRAMUSCULAR | Status: DC | PRN
  Administered 2023-06-11: 4 mg via INTRAVENOUS

## 2023-06-11 MED ORDER — ONDANSETRON HCL 4 MG/2ML IJ SOLN
INTRAMUSCULAR | Status: AC
  Filled 2023-06-11: qty 2

## 2023-06-11 MED ORDER — ENOXAPARIN SODIUM 40 MG/0.4ML IJ SOSY
40.0000 mg | PREFILLED_SYRINGE | Freq: Every day | INTRAMUSCULAR | Status: DC
  Administered 2023-06-12: 40 mg via SUBCUTANEOUS
  Filled 2023-06-11: qty 0.4

## 2023-06-11 MED ORDER — HYDROMORPHONE HCL 1 MG/ML IJ SOLN
0.2500 mg | INTRAMUSCULAR | Status: DC | PRN
  Administered 2023-06-11: 0.5 mg via INTRAVENOUS

## 2023-06-11 MED ORDER — ACETAMINOPHEN 500 MG PO TABS
1000.0000 mg | ORAL_TABLET | Freq: Once | ORAL | Status: AC
  Administered 2023-06-11: 1000 mg via ORAL
  Filled 2023-06-11: qty 2

## 2023-06-11 MED ORDER — KETOROLAC TROMETHAMINE 15 MG/ML IJ SOLN
15.0000 mg | Freq: Four times a day (QID) | INTRAMUSCULAR | Status: DC
  Administered 2023-06-11 – 2023-06-12 (×4): 15 mg via INTRAVENOUS
  Filled 2023-06-11 (×4): qty 1

## 2023-06-11 MED ORDER — BISACODYL 5 MG PO TBEC
10.0000 mg | DELAYED_RELEASE_TABLET | Freq: Every day | ORAL | Status: DC
  Administered 2023-06-12: 10 mg via ORAL
  Filled 2023-06-11: qty 2

## 2023-06-11 MED ORDER — EPHEDRINE SULFATE-NACL 50-0.9 MG/10ML-% IV SOSY
PREFILLED_SYRINGE | INTRAVENOUS | Status: DC | PRN
  Administered 2023-06-11: 10 mg via INTRAVENOUS

## 2023-06-11 MED ORDER — CHLORHEXIDINE GLUCONATE 0.12 % MT SOLN
15.0000 mL | Freq: Once | OROMUCOSAL | Status: AC
  Administered 2023-06-11: 15 mL via OROMUCOSAL
  Filled 2023-06-11: qty 15

## 2023-06-11 MED ORDER — ROCURONIUM BROMIDE 10 MG/ML (PF) SYRINGE
PREFILLED_SYRINGE | INTRAVENOUS | Status: DC | PRN
  Administered 2023-06-11: 60 mg via INTRAVENOUS

## 2023-06-11 MED ORDER — HYDROMORPHONE HCL 1 MG/ML IJ SOLN
INTRAMUSCULAR | Status: AC
  Filled 2023-06-11: qty 1

## 2023-06-11 MED ORDER — ACETAMINOPHEN 500 MG PO TABS
1000.0000 mg | ORAL_TABLET | Freq: Four times a day (QID) | ORAL | Status: DC
  Administered 2023-06-11 – 2023-06-12 (×4): 1000 mg via ORAL
  Filled 2023-06-11 (×4): qty 2

## 2023-06-11 MED ORDER — 0.9 % SODIUM CHLORIDE (POUR BTL) OPTIME
TOPICAL | Status: DC | PRN
  Administered 2023-06-11: 2000 mL

## 2023-06-11 MED ORDER — PROPOFOL 10 MG/ML IV BOLUS
INTRAVENOUS | Status: DC | PRN
  Administered 2023-06-11 (×3): 40 mg via INTRAVENOUS
  Administered 2023-06-11: 100 mg via INTRAVENOUS

## 2023-06-11 MED ORDER — PROMETHAZINE HCL 25 MG/ML IJ SOLN: 6.2500 mg | INTRAMUSCULAR | Status: DC | PRN

## 2023-06-11 MED ORDER — LACTATED RINGERS IV SOLN: INTRAVENOUS | Status: DC

## 2023-06-11 MED ORDER — ROSUVASTATIN CALCIUM 5 MG PO TABS: 10.0000 mg | ORAL_TABLET | ORAL | Status: DC

## 2023-06-11 MED ORDER — OXYCODONE HCL 5 MG PO TABS
5.0000 mg | ORAL_TABLET | ORAL | Status: DC | PRN
  Administered 2023-06-12: 10 mg via ORAL
  Filled 2023-06-11: qty 2

## 2023-06-11 MED ORDER — PANTOPRAZOLE SODIUM 40 MG PO TBEC
40.0000 mg | DELAYED_RELEASE_TABLET | Freq: Every day | ORAL | Status: DC
  Administered 2023-06-12: 40 mg via ORAL
  Filled 2023-06-11: qty 1

## 2023-06-11 MED ORDER — METHYLENE BLUE (ANTIDOTE) 1 % IV SOLN
INTRAVENOUS | Status: DC | PRN
  Administered 2023-06-11: 1 mL

## 2023-06-11 MED ORDER — PHENYLEPHRINE 80 MCG/ML (10ML) SYRINGE FOR IV PUSH (FOR BLOOD PRESSURE SUPPORT)
PREFILLED_SYRINGE | INTRAVENOUS | Status: DC | PRN
  Administered 2023-06-11: 80 ug via INTRAVENOUS

## 2023-06-11 MED ORDER — CEFAZOLIN SODIUM-DEXTROSE 2-4 GM/100ML-% IV SOLN
2.0000 g | INTRAVENOUS | Status: AC
  Administered 2023-06-11: 2 g via INTRAVENOUS
  Filled 2023-06-11: qty 100

## 2023-06-11 MED ORDER — LEVOTHYROXINE SODIUM 88 MCG PO TABS
88.0000 ug | ORAL_TABLET | Freq: Every day | ORAL | Status: DC
  Administered 2023-06-12: 88 ug via ORAL
  Filled 2023-06-11: qty 1

## 2023-06-11 MED ORDER — PROPOFOL 10 MG/ML IV BOLUS
INTRAVENOUS | Status: AC
  Filled 2023-06-11: qty 20

## 2023-06-11 MED ORDER — LIDOCAINE 2% (20 MG/ML) 5 ML SYRINGE
INTRAMUSCULAR | Status: DC | PRN
  Administered 2023-06-11: 60 mg via INTRAVENOUS
  Administered 2023-06-11: 10 mg via INTRAVENOUS

## 2023-06-11 MED ORDER — INDOCYANINE GREEN 25 MG IV SOLR
INTRAVENOUS | Status: AC
  Filled 2023-06-11: qty 10

## 2023-06-11 MED ORDER — METHYLENE BLUE (ANTIDOTE) 1 % IV SOLN
INTRAVENOUS | Status: AC
  Filled 2023-06-11: qty 10

## 2023-06-11 MED ORDER — BUPIVACAINE HCL (PF) 0.5 % IJ SOLN
INTRAMUSCULAR | Status: AC
  Filled 2023-06-11: qty 30

## 2023-06-11 MED ORDER — ALPRAZOLAM 0.5 MG PO TABS
0.5000 mg | ORAL_TABLET | Freq: Three times a day (TID) | ORAL | Status: DC
  Administered 2023-06-11 – 2023-06-12 (×2): 0.5 mg via ORAL
  Filled 2023-06-11 (×3): qty 1

## 2023-06-11 MED ORDER — LISINOPRIL 10 MG PO TABS
10.0000 mg | ORAL_TABLET | Freq: Every day | ORAL | Status: DC
  Administered 2023-06-12: 10 mg via ORAL
  Filled 2023-06-11: qty 1

## 2023-06-11 MED ORDER — MIDAZOLAM HCL 2 MG/2ML IJ SOLN
INTRAMUSCULAR | Status: AC
  Filled 2023-06-11: qty 2

## 2023-06-11 SURGICAL SUPPLY — 83 items
ADH SKN CLS APL DERMABOND .7 (GAUZE/BANDAGES/DRESSINGS) ×1
APL PRP STRL LF DISP 70% ISPRP (MISCELLANEOUS) ×1
BAG SPEC RTRVL C125 8X14 (MISCELLANEOUS) ×1
BLADE CLIPPER SURG (BLADE) ×1 IMPLANT
CANISTER SUCT 3000ML PPV (MISCELLANEOUS) ×2 IMPLANT
CANNULA REDUCER 12-8 DVNC XI (CANNULA) ×2 IMPLANT
CATH THORACIC 28FR (CATHETERS) ×1 IMPLANT
CHLORAPREP W/TINT 26 (MISCELLANEOUS) ×1 IMPLANT
CLIP TI MEDIUM 6 (CLIP) IMPLANT
CNTNR URN SCR LID CUP LEK RST (MISCELLANEOUS) ×5 IMPLANT
CONN ST 1/4X3/8 BEN (MISCELLANEOUS) IMPLANT
CONT SPEC 4OZ STRL OR WHT (MISCELLANEOUS) ×7
DEFOGGER SCOPE WARMER CLEARIFY (MISCELLANEOUS) ×1 IMPLANT
DERMABOND ADVANCED .7 DNX12 (GAUZE/BANDAGES/DRESSINGS) ×1 IMPLANT
DRAIN CHANNEL 28F RND 3/8 FF (WOUND CARE) IMPLANT
DRAIN CHANNEL 32F RND 10.7 FF (WOUND CARE) IMPLANT
DRAPE ARM DVNC X/XI (DISPOSABLE) ×4 IMPLANT
DRAPE COLUMN DVNC XI (DISPOSABLE) ×1 IMPLANT
DRAPE CV SPLIT W-CLR ANES SCRN (DRAPES) ×1 IMPLANT
DRAPE HALF SHEET 40X57 (DRAPES) ×1 IMPLANT
DRAPE ORTHO SPLIT 77X108 STRL (DRAPES) ×1
DRAPE SURG ORHT 6 SPLT 77X108 (DRAPES) ×1 IMPLANT
ELECT BLADE 6.5 EXT (BLADE) IMPLANT
ELECT REM PT RETURN 9FT ADLT (ELECTROSURGICAL) ×1
ELECTRODE REM PT RTRN 9FT ADLT (ELECTROSURGICAL) ×1 IMPLANT
FORCEPS BPLR LNG DVNC XI (INSTRUMENTS) IMPLANT
FORCEPS CADIERE DVNC XI (FORCEP) IMPLANT
GAUZE KITTNER 4X5 RF (MISCELLANEOUS) ×1 IMPLANT
GAUZE SPONGE 4X4 12PLY STRL (GAUZE/BANDAGES/DRESSINGS) ×1 IMPLANT
GLOVE BIO SURGEON STRL SZ7.5 (GLOVE) ×2 IMPLANT
GLOVE SURG SS PI 8.0 STRL IVOR (GLOVE) ×1 IMPLANT
GOWN STRL REUS W/ TWL LRG LVL3 (GOWN DISPOSABLE) ×2 IMPLANT
GOWN STRL REUS W/ TWL XL LVL3 (GOWN DISPOSABLE) ×2 IMPLANT
GOWN STRL REUS W/TWL 2XL LVL3 (GOWN DISPOSABLE) ×1 IMPLANT
GOWN STRL REUS W/TWL LRG LVL3 (GOWN DISPOSABLE) ×2
GOWN STRL REUS W/TWL XL LVL3 (GOWN DISPOSABLE) ×2
GRASPER TIP-UP FEN DVNC XI (INSTRUMENTS) IMPLANT
HEMOSTAT SURGICEL 2X14 (HEMOSTASIS) ×3 IMPLANT
KIT BASIN OR (CUSTOM PROCEDURE TRAY) ×1 IMPLANT
KIT TURNOVER KIT B (KITS) ×1 IMPLANT
NDL 22X1.5 STRL (OR ONLY) (MISCELLANEOUS) ×1 IMPLANT
NEEDLE 22X1.5 STRL (OR ONLY) (MISCELLANEOUS) ×1 IMPLANT
NS IRRIG 1000ML POUR BTL (IV SOLUTION) ×3 IMPLANT
PACK CHEST (CUSTOM PROCEDURE TRAY) ×1 IMPLANT
PAD ARMBOARD 7.5X6 YLW CONV (MISCELLANEOUS) ×5 IMPLANT
PORT ACCESS TROCAR AIRSEAL 12 (TROCAR) ×1 IMPLANT
RELOAD STAPLE 45 3.5 BLU DVNC (STAPLE) IMPLANT
RELOAD STAPLE 45 4.3 GRN DVNC (STAPLE) IMPLANT
RELOAD STAPLER 3.5X45 BLU DVNC (STAPLE) ×5 IMPLANT
RELOAD STAPLER 4.3X45 GRN DVNC (STAPLE) ×2 IMPLANT
SEAL UNIV 5-12 XI (MISCELLANEOUS) ×4 IMPLANT
SET TRI-LUMEN FLTR TB AIRSEAL (TUBING) ×1 IMPLANT
SOL ELECTROSURG ANTI STICK (MISCELLANEOUS) ×1
SOLUTION ELECTROSURG ANTI STCK (MISCELLANEOUS) ×1 IMPLANT
STAPLER 45 SUREFORM DVNC (STAPLE) IMPLANT
STAPLER RELOAD 3.5X45 BLU DVNC (STAPLE) ×5
STAPLER RELOAD 4.3X45 GRN DVNC (STAPLE) ×2
STOPCOCK 4 WAY LG BORE MALE ST (IV SETS) ×1 IMPLANT
SUT PDS AB 1 CTX 36 (SUTURE) IMPLANT
SUT PROLENE 4 0 RB 1 (SUTURE)
SUT PROLENE 4-0 RB1 .5 CRCL 36 (SUTURE) IMPLANT
SUT SILK 1 MH (SUTURE) ×1 IMPLANT
SUT SILK 2 0 SH (SUTURE) IMPLANT
SUT SILK 2 0SH CR/8 30 (SUTURE) IMPLANT
SUT VIC AB 1 CTX 36 (SUTURE)
SUT VIC AB 1 CTX36XBRD ANBCTR (SUTURE) IMPLANT
SUT VIC AB 2-0 CT1 27 (SUTURE) ×1
SUT VIC AB 2-0 CT1 TAPERPNT 27 (SUTURE) ×1 IMPLANT
SUT VIC AB 3-0 SH 27 (SUTURE) ×3
SUT VIC AB 3-0 SH 27X BRD (SUTURE) ×3 IMPLANT
SUT VICRYL 0 TIES 12 18 (SUTURE) ×1 IMPLANT
SUT VICRYL 0 UR6 27IN ABS (SUTURE) ×2 IMPLANT
SYR 10ML LL (SYRINGE) ×1 IMPLANT
SYR 20ML LL LF (SYRINGE) ×1 IMPLANT
SYR 50ML LL SCALE MARK (SYRINGE) ×1 IMPLANT
SYSTEM RETRIEVAL ANCHOR 8 (MISCELLANEOUS) IMPLANT
SYSTEM SAHARA CHEST DRAIN ATS (WOUND CARE) ×1 IMPLANT
TAPE CLOTH 4X10 WHT NS (GAUZE/BANDAGES/DRESSINGS) ×1 IMPLANT
TOWEL GREEN STERILE (TOWEL DISPOSABLE) ×1 IMPLANT
TRAY FOLEY MTR SLVR 16FR STAT (SET/KITS/TRAYS/PACK) ×1 IMPLANT
TRAY FOLEY W/BAG SLVR 14FR (SET/KITS/TRAYS/PACK) ×1 IMPLANT
TUBING EXTENTION W/L.L. (IV SETS) ×1 IMPLANT
WATER STERILE IRR 1000ML POUR (IV SOLUTION) ×1 IMPLANT

## 2023-06-11 NOTE — Anesthesia Postprocedure Evaluation (Signed)
Anesthesia Post Note  Patient: Linda Mayer  Procedure(s) Performed: XI ROBOTIC ASSISTED THORACOSCOPY-RIGHT UPPER LOBE AND RIGHT MIDDLE LOBE WEDGE RESECTION (Right: Chest) INTERCOSTAL NERVE BLOCK (Right: Chest) NODE DISSECTION (Right: Chest)     Patient location during evaluation: PACU Anesthesia Type: General Level of consciousness: sedated and patient cooperative Pain management: pain level controlled Vital Signs Assessment: post-procedure vital signs reviewed and stable Respiratory status: spontaneous breathing Cardiovascular status: stable Anesthetic complications: no   No notable events documented.  Last Vitals:  Vitals:   06/11/23 1418 06/11/23 1632  BP: (!) 143/63 118/62  Pulse:  86  Resp:  15  Temp: 36.7 C 36.7 C  SpO2:  96%    Last Pain:  Vitals:   06/11/23 1632  TempSrc: Oral  PainSc:                  Lewie Loron

## 2023-06-11 NOTE — Transfer of Care (Signed)
Immediate Anesthesia Transfer of Care Note  Patient: Linda Mayer  Procedure(s) Performed: ROBOTIC ASSISTED NAVIGATIONAL BRONCHOSCOPY Dye Marking  Patient Location: PACU  Anesthesia Type:General  Level of Consciousness: awake, alert , and oriented  Airway & Oxygen Therapy: Patient Spontanous Breathing  Post-op Assessment: Report given to RN and Post -op Vital signs reviewed and stable  Post vital signs: Reviewed and stable  Last Vitals:  Vitals Value Taken Time  BP 138/41 06/11/23 1016  Temp    Pulse 55 06/11/23 1020  Resp 14 06/11/23 1020  SpO2 100 % 06/11/23 1020  Vitals shown include unfiled device data.  Last Pain:  Vitals:   06/11/23 0546  TempSrc: Oral         Complications: No notable events documented.

## 2023-06-11 NOTE — Anesthesia Procedure Notes (Signed)
Arterial Line Insertion Start/End9/06/2023 7:10 AM, 06/11/2023 7:15 AM Performed by: Cy Blamer, CRNA, CRNA  Patient location: Pre-op. Preanesthetic checklist: patient identified, IV checked, site marked, risks and benefits discussed, surgical consent, monitors and equipment checked, pre-op evaluation, timeout performed and anesthesia consent Lidocaine 1% used for infiltration Left, radial was placed Catheter size: 20 G Hand hygiene performed  and maximum sterile barriers used  Allen's test indicative of satisfactory collateral circulation Attempts: 1 Procedure performed without using ultrasound guided technique. Following insertion, dressing applied and Biopatch. Post procedure assessment: normal and unchanged  Patient tolerated the procedure well with no immediate complications. Additional procedure comments: Performed by Dorna Leitz.

## 2023-06-11 NOTE — Op Note (Signed)
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  Estimated Blood Loss: Minimal  Complications: None  Indications and History: Linda Mayer is a 67 y.o. female with history of right middle lobe and right upper lobe carcinoid tumor diagnosed by navigational bronchoscopy on 03/20/2023.  These will be resected by robotic VATS by Dr. Cliffton Asters.  She is here for repeat robotic navigational bronchoscopy and dye marking of the 2 carcinoid tumors.. The risks, benefits, complications, treatment options and expected outcomes were discussed with the patient.  The possibilities of pneumothorax, pneumonia, reaction to medication, pulmonary aspiration, perforation of a viscus, bleeding, failure to diagnose a condition and creating a complication requiring transfusion or operation were discussed with the patient who freely signed the consent.    Description of Procedure: The patient was seen in the Preoperative Area, was examined and was deemed appropriate to proceed.  The patient was taken to [facility] endoscopy room 3, identified as Linda Mayer and the procedure verified as Flexible Video Fiberoptic Bronchoscopy.  A Time Out was held and the above information confirmed.   Prior to the date of the procedure a high-resolution CT scan of the chest was performed. Utilizing ION software program a virtual tracheobronchial tree was generated to allow the creation of distinct navigation pathways to the patient's parenchymal abnormalities. After being taken to the operating room general anesthesia was initiated and the patient  was orally intubated. The video fiberoptic bronchoscope was  introduced via the endotracheal tube and a general inspection was performed which showed normal right and left lung anatomy. Aspiration of the bilateral mainstems was completed to remove any remaining secretions. Robotic catheter inserted into patient's endotracheal tube.   Target #1 right middle lobe carcinoid tumor: The distinct navigation pathways prepared prior to this procedure were then utilized to navigate to patient's lesion identified on CT scan. The robotic catheter was secured into place and the vision probe was withdrawn.  Lesion location was approximated using fluoroscopy.  Local registration and targeting was performed using Cios three-dimensional imaging. Under fluoroscopic guidance 0.5 cc of 1: 1 indocyanine green: Methylene blue was injected into the nodule using a transbronchial needle.   Target #2 right upper lobe carcinoid tumor: The distinct navigation pathways prepared prior to this procedure were then utilized to navigate to patient's lesion identified on CT scan. The robotic catheter was secured into place and the vision probe was withdrawn.  Lesion location was approximated using fluoroscopy.  Local registration and targeting was performed using Cios three-dimensional imaging. Under fluoroscopic guidance 0.5 cc of 1: 1 indocyanine green: Methylene blue was injected into the nodule using a transbronchial needle.  At the end of the procedure a general airway inspection was performed and there was minimal active bleeding, slight oozing from the right upper lobe bronchus.  The patient was deemed stable for transfer to the operating room for the robotic VATS resection procedure.  Please refer also to Dr. Lucilla Lame procedure note.   Plans:  The patient will be transferred to the OR for robotic VATS wedge resections.  Levy Pupa, MD, PhD 06/11/2023, 8:29 AM [facility] [phone] or if no answer before 7:00PM call [phone] For any issues after  7:00PM please call eLink [phone]

## 2023-06-11 NOTE — Op Note (Signed)
      [address]       [place] [address]             [phone]        06/11/2023  Patient:  MACKYNZI HAFLER Pre-Op Dx: Right upper lobe carcinoid tumor     Right middle lobe carcinoid tumor Post-op Dx:  same Procedure: - Robotic assisted right video thoracoscopy - Wedge resection of the right upper and middle  lobes - Mediastinal lymph node sampling - Intercostal nerve block  Surgeon and Role:      * Jaedon Siler, Eliezer Lofts, MD - Primary Assistant:  Jaclyn Prime, PA-C An experienced assistant was required given the complexity of this surgery and the standard of surgical care. The assistant was needed for exposure, dissection, suctioning, retraction of delicate tissues and sutures, instrument exchange and for overall help during this procedure.  Anesthesia  general EBL:  10ml Blood Administration: none Specimen:   - Wedge resection of the right upper and middle lobes  - Mediastinal lymph nodes.  Drains: 80 F argyle chest tube in right chest Counts: correct   Indications: 67yo female with RUL and RML biopsy proven carcinoid tumors. She will require a Dotatate scan for completetion. If clear for metastatic disease we will proceed with navigational bronchoscopy for marking of the two lesions followed by a R RATS, RUL and RML wedge resections. The risks and benefits have been discussed, and she is agreeable to proceed.   Findings: Good dye marking for localization.  Normal appearing lymph nodes grossly  Operative Technique: After the risks, benefits and alternatives were thoroughly discussed, the patient was brought to the operative theatre.  Anesthesia was induced, and the patient was then placed in a left lateral decubitus position and was prepped and draped in normal sterile fashion.  An appropriate surgical pause was performed, and pre-operative antibiotics were dosed accordingly.  We began by placing our 4 robotic ports in the 7th intercostal space targeting  the hilum of the lung.  The robot was then docked and all instruments were passed under direct visualization.    The lung was then retracted superiorly, and the inferior pulmonary ligament was divided.  Lymph nodes were sampled at level 9 and 7.  There were no obvious lymph nodes at level 4.  Wedge resections of the upper and middle lobes were performed.  The specimens were remove with an endocatch bag.  An intercostal nerve block was performed under direct visualization.  A 27F chest with then placed, and we watch the remaining lobes re-expand.  The skin and soft tissue were closed with absorbable suture.    The patient tolerated the procedure without any immediate complications, and was transferred to the PACU in stable condition.  Aliayah Tyer Keane Scrape

## 2023-06-11 NOTE — H&P (Signed)
[address]       [place] [address]             [phone]                                                   Linda Mayer [facility] Medical Record #[id number] Date of Birth: [birth year]   Referring: Karren Burly, MD Primary Care: [facility] Primary Cardiologist: Marjo Bicker, MD   Chief Complaint:        Chief Complaint  Patient presents with   Lung Cancer      New patient consultation, CT super D 5/22, Bronch 6/18, PFTs 7/15        History of Present Illness:    Linda Mayer 67 y.o. female presents for surgical evaluation of a biopsy proven carcinoid tumors in the right upper and middle lobes.  She is asymptomatic from a pulmonary standpoint.  She denies any weight changes or neurologic symptoms.  She continues to smoke, but is willing to stop next week.                 Past Medical History:  Diagnosis Date   AAA (abdominal aortic aneurysm) (HCC)     Anxiety     Complication of anesthesia      Pt states that her BP drops really low after anesthesia. Hard time waking up   Coronary artery disease     Depression      situational   Dizziness     GERD (gastroesophageal reflux disease)     Hypertension     Hypothyroidism     Peripheral vascular disease (HCC)                 Past Surgical History:  Procedure Laterality Date   ABDOMINAL HYSTERECTOMY       BARTHOLIN CYST MARSUPIALIZATION        Removal   BRONCHIAL BIOPSY   03/20/2023    Procedure: BRONCHIAL BIOPSIES;  Surgeon: Leslye Peer, MD;  Location: [facility] ENDOSCOPY;  Service: Pulmonary;;   BRONCHIAL BRUSHINGS   03/20/2023    Procedure: BRONCHIAL BRUSHINGS;  Surgeon: Leslye Peer, MD;  Location[facility] ENDOSCOPY;  Service: Pulmonary;;   BRONCHIAL NEEDLE ASPIRATION BIOPSY   03/20/2023    Procedure: BRONCHIAL NEEDLE ASPIRATION BIOPSIES;  Surgeon: Leslye Peer, MD;  Location: [facility] ENDOSCOPY;  Service: Pulmonary;;   BRONCHIAL WASHINGS   03/20/2023     Procedure: BRONCHIAL WASHINGS;  Surgeon: Leslye Peer, MD;  Location: [facility] ENDOSCOPY;  Service: Pulmonary;;   THYROIDECTOMY N/A 05/12/2016    Procedure: THYROIDECTOMY;  Surgeon: Franky Macho, MD;  Location: [facility] ORS;  Service: General;  Laterality: N/A;   VESICOVAGINAL FISTULA CLOSURE W/ TAH                   Family History  Problem Relation Age of Onset   Heart disease Brother     Thyroid disease Brother     Hypotension Brother              Tobacco Use History  Social History        Tobacco Use  Smoking Status Every Day   Current packs/day: 0.50   Average packs/day: 0.5 packs/day for 50.0 years (25.0 ttl pk-yrs)  Types: Cigarettes  Smokeless Tobacco Never  Tobacco Comments    Currently at 5 cigarettes per day.       Social History       Substance and Sexual Activity  Alcohol Use No        Allergies       Allergies  Allergen Reactions   Zetia [Ezetimibe] Diarrhea   Zocor [Simvastatin]        Muscle pain               Current Outpatient Medications  Medication Sig Dispense Refill   ALPRAZolam (XANAX) 0.5 MG tablet Take 0.5 mg by mouth 3 (three) times daily.       aspirin 81 MG chewable tablet Chew 81 mg by mouth daily.       Calcium Carb-Cholecalciferol (CALCIUM 600 + D PO) Take 1 tablet by mouth 2 (two) times daily.       ciprofloxacin (CIPRO) 500 MG tablet Take 1 tablet (500 mg total) by mouth every 12 (twelve) hours. 14 tablet 0   levothyroxine (SYNTHROID) 88 MCG tablet Take 88 mcg by mouth daily.       lisinopril (PRINIVIL,ZESTRIL) 10 MG tablet Take 10 mg by mouth daily.       mirtazapine (REMERON) 15 MG tablet Take 15 mg by mouth at bedtime.       naproxen sodium (ALEVE) 220 MG tablet Take 440 mg by mouth 2 (two) times daily as needed (pain).       rosuvastatin (CRESTOR) 10 MG tablet Take 10 mg by mouth 2 (two) times a week.       Tiotropium Bromide-Olodaterol (STIOLTO RESPIMAT) 2.5-2.5 MCG/ACT AERS Inhale 2 puffs into the lungs daily. 8 g 0    VITAMIN D PO Take 1 capsule by mouth daily.          No current facility-administered medications for this visit.        Review of Systems  Constitutional:  Negative for fever and malaise/fatigue.  Respiratory:  Negative for cough and shortness of breath.   Cardiovascular:  Negative for chest pain.  Musculoskeletal: Negative.   Neurological: Negative.         PHYSICAL EXAMINATION: BP (!) 143/57 (BP Location: Left Arm, Patient Position: Sitting, Cuff Size: Normal)   Pulse (!) 55   Resp 20   Ht 5\' 5"  (1.651 m)   Wt 141 lb (64 kg)   SpO2 97% Comment: RA  BMI 23.46 kg/m  Physical Exam Constitutional:      General: She is not in acute distress. HENT:     Head: Normocephalic and atraumatic.  Eyes:     Extraocular Movements: Extraocular movements intact.  Cardiovascular:     Rate and Rhythm: Bradycardia present.  Pulmonary:     Effort: Pulmonary effort is normal. No respiratory distress.  Abdominal:     General: Abdomen is flat. There is no distension.  Musculoskeletal:        General: Normal range of motion.     Cervical back: Normal range of motion.  Skin:    General: Skin is warm and dry.  Neurological:     General: No focal deficit present.     Mental Status: She is alert and oriented to person, place, and time.                I have independently reviewed the above radiology studies  and reviewed the findings with the patient.    Recent Lab Findings: Recent Labs  Lab Results  Component Value Date    WBC 7.2 03/20/2023    HGB 13.7 03/20/2023    HCT 43.0 03/20/2023    PLT 170 03/20/2023    GLUCOSE 106 (H) 03/20/2023    ALT 14 05/13/2016    AST 16 05/13/2016    NA 141 03/20/2023    K 4.6 03/20/2023    CL 106 03/20/2023    CREATININE 0.96 03/20/2023    BUN 21 03/20/2023    CO2 28 03/20/2023    TSH 0.01 (L) 03/09/2017        Diagnostic Studies & Laboratory data:     Recent Radiology Findings:    Imaging Results  No results found.        PFTs:   - FVC: 79% - FEV1: 78% -DLCO: 109%   FINAL MICROSCOPIC DIAGNOSIS:  A.  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  She will require a Dotatate scan for completetion.  If clear for metastatic disease we will proceed with navigational bronchoscopy for marking of the two lesions followed by a R RATS, RUL and RML wedge resections.  The risks and benefits have been discussed, and she is agreeable to proceed.

## 2023-06-11 NOTE — Hospital Course (Addendum)
History of Present Illness:  Linda Mayer is a 67 yo female with history of tobacco abuse and known pulmonary nodules.  She has been followed by Dr. Judeth Horn for this.  She recently underwent Navigational Bronchoscopy with biopsy of the right middle and right upper lobe nodules.  Pathology confirmed the presence of carcinoid tumor.  She was subsequently referred to Dr. Cliffton Asters for surgical resection.  At her visit she denied symptoms of weight loss, cough, and respiratory symptoms.  She continues to smoke but is willing to stop.  He recommended surgical resection, the risks and benefits of the procedure were explained to the patient and she was agreeable to proceed.  Hospital Course:    [name] presented to [facility] on 06/11/2023.  She was taken to the Endoscopy suite and underwent navigational bronchoscopy for dye marking of her nodules.  She was then taken to the operating room and underwent Right Robotic Assisted Right Upper Lobe Wedge Resection, Right Middle Lobe Wedge Resection, and Lymph Node Dissection.  She tolerated the procedure without difficulty, was extubated, and taken to the PACU in stable condition.  Postoperative hospital course:  The patient is doing well.  Chest tube was removed on postop day 1 following a clamping trial.  She has remained afebrile with stable vital signs.  She is not requiring oxygen and maintaining saturations on room air.  She has tolerated diet advancement.  She is tolerating routine activities using standard protocols.  Incisions are noted to be healing well without evidence of infection.  She has a mild postoperative expected acute blood loss anemia.  Renal function has remained normal and she has good urine output.  Overall, at the time of discharge the patient is felt to be quite stable.

## 2023-06-11 NOTE — Brief Op Note (Signed)
06/11/2023  9:53 AM  PATIENT:  Dellie Catholic  67 y.o. female  PRE-OPERATIVE DIAGNOSIS:  Carcinoid  POST-OPERATIVE DIAGNOSIS:  Carcinoid  PROCEDURE:  Procedure(s):  XI ROBOTIC ASSISTED THORACOSCOPY -RIGHT UPPER LOBE AND RIGHT MIDDLE LOBE WEDGE RESECTION  -Lymph Node Sampling  INTERCOSTAL NERVE BLOCK (Right)  SURGEON:  Surgeons and Role:    * Lightfoot, Eliezer Lofts, MD - Primary  PHYSICIAN ASSISTANT: Lowella Dandy PA-C  ASSISTANTS: none   ANESTHESIA:   general  EBL:  {None/Minimal: 21241}   BLOOD ADMINISTERED:none  DRAINS:  Right Pleural Chest Tube    LOCAL MEDICATIONS USED:  Exparel  SPECIMEN:  Source of Specimen:  Right Upper Lobe Wedge, Right Middle Lobe Wedge, Lymph Node  DISPOSITION OF SPECIMEN:  PATHOLOGY  COUNTS:  YES  TOURNIQUET:  * No tourniquets in log *  DICTATION: .Dragon Dictation  PLAN OF CARE: Admit to inpatient   PATIENT DISPOSITION:  PACU - hemodynamically stable.   Delay start of Pharmacological VTE agent (>24hrs) due to surgical blood loss or risk of bleeding: no

## 2023-06-11 NOTE — Transfer of Care (Signed)
Immediate Anesthesia Transfer of Care Note  Patient: Linda Mayer  Procedure(s) Performed: XI ROBOTIC ASSISTED THORACOSCOPY-RIGHT UPPER LOBE AND RIGHT MIDDLE LOBE WEDGE RESECTION (Right: Chest) INTERCOSTAL NERVE BLOCK (Right: Chest) NODE DISSECTION (Right: Chest)  Patient Location: PACU  Anesthesia Type:General  Level of Consciousness: drowsy, patient cooperative, and responds to stimulation  Airway & Oxygen Therapy: Patient Spontanous Breathing and Patient connected to face mask oxygen  Post-op Assessment: Report given to RN, Post -op Vital signs reviewed and stable, Patient moving all extremities X 4, and Patient able to stick tongue midline  Post vital signs: Reviewed and stable  Last Vitals:  Vitals Value Taken Time  BP 138/41 06/11/23 1016  Temp 98.6   Pulse 55 06/11/23 1020  Resp 14 06/11/23 1020  SpO2 100 % 06/11/23 1020  Vitals shown include unfiled device data.  Last Pain:  Vitals:   06/11/23 0546  TempSrc: Oral         Complications: No notable events documented.

## 2023-06-11 NOTE — H&P (Signed)
Linda Mayer is an 67 y.o. female.   Chief Complaint: carcinoid tumors HPI:  67 year old woman with a history of tobacco use, followed by Dr. Judeth Horn in our office for small pulmonary nodules.  She went for navigational bronchoscopy and biopsy of right middle lobe and right upper lobe nodules.  Both of these were consistent with carcinoid tumor.  She then saw Dr. Cliffton Asters and is planning for surgical wedge resection.  She presents today for navigational bronchoscopy for dye marking of each nodule to facilitate her thoracic surgery.  Risks, benefits, rationale of the procedure explained and discussed.  She understands and agrees to proceed.  No new issues reported   Past Medical History:  Diagnosis Date   AAA (abdominal aortic aneurysm) (HCC)    Anxiety    Carcinoid tumor of right lung    Complication of anesthesia    Pt states that her BP drops really low after anesthesia. Hard time waking up   Coronary artery disease    Depression    situational   Dizziness    GERD (gastroesophageal reflux disease)    Headache    Hypertension    Hypothyroidism    Peripheral vascular disease (HCC)     Past Surgical History:  Procedure Laterality Date   ABDOMINAL HYSTERECTOMY     BARTHOLIN CYST MARSUPIALIZATION     Removal   BRONCHIAL BIOPSY  03/20/2023   Procedure: BRONCHIAL BIOPSIES;  Surgeon: Leslye Peer, MD;  Location: [facility] ENDOSCOPY;  Service: Pulmonary;;   BRONCHIAL BRUSHINGS  03/20/2023   Procedure: BRONCHIAL BRUSHINGS;  Surgeon: Leslye Peer, MD;  Location: [facility] ENDOSCOPY;  Service: Pulmonary;;   BRONCHIAL NEEDLE ASPIRATION BIOPSY  03/20/2023   Procedure: BRONCHIAL NEEDLE ASPIRATION BIOPSIES;  Surgeon: Leslye Peer, MD;  Location: [facility] ENDOSCOPY;  Service: Pulmonary;;   BRONCHIAL WASHINGS  03/20/2023   Procedure: BRONCHIAL WASHINGS;  Surgeon: Leslye Peer, MD;  Location: [facility] ENDOSCOPY;  Service: Pulmonary;;   THYROIDECTOMY N/A 05/12/2016   Procedure: THYROIDECTOMY;  Surgeon: Franky Macho, MD;  Location: [facility] ORS;  Service: General;  Laterality: N/A;   VESICOVAGINAL FISTULA CLOSURE W/ TAH      Family History  Problem Relation Age of Onset   Heart disease Brother    Thyroid disease Brother    Hypotension Brother    Social History:  reports that she quit smoking about 2 weeks ago. Her smoking use included cigarettes. She has a 25 pack-year smoking history. She has never used smokeless tobacco. She reports that she does not drink alcohol and does not use drugs.  Allergies:  Allergies  Allergen Reactions   Zetia [Ezetimibe] Diarrhea   Zocor [Simvastatin]     Muscle pain     Medications Prior to Admission  Medication Sig Dispense Refill   ALPRAZolam (XANAX) 0.5 MG tablet Take 0.5 mg by mouth 3 (three) times daily.     aspirin 81 MG chewable tablet Chew 81 mg by mouth daily.     Calcium Carb-Cholecalciferol (CALCIUM 600 + D PO) Take 1 tablet by mouth 2 (two) times daily.     cholecalciferol (VITAMIN D3) 25 MCG (1000 UNIT) tablet Take 1,000 Units by mouth daily.     ciprofloxacin (CIPRO) 500 MG tablet Take 1 tablet (500 mg total) by mouth 2 (two) times daily. 5 tablet 0   levothyroxine (SYNTHROID) 88 MCG tablet Take 88 mcg by mouth daily.     lisinopril (PRINIVIL,ZESTRIL) 10 MG tablet Take 10 mg by mouth daily.  mirtazapine (REMERON) 15 MG tablet Take 15 mg by mouth at bedtime.     naproxen sodium (ALEVE) 220 MG tablet Take 440 mg by mouth every 4 (four) hours as needed (pain).     rosuvastatin (CRESTOR) 10 MG tablet Take 10 mg by mouth 2 (two) times a week.     Tiotropium Bromide-Olodaterol (STIOLTO RESPIMAT) 2.5-2.5 MCG/ACT AERS Inhale 2 puffs into the lungs daily. (Patient taking differently: Inhale 2 puffs into the lungs daily as needed (shortness of breath).) 8 g 0    No results found for this or any previous visit (from the past 48 hour(s)). No results found.  Review of Systems As per HPI No new issues or complaints reported Blood pressure (!) 168/72,  pulse 62, temperature 98 F (36.7 C), temperature source Oral, resp. rate 17, height 5\' 5"  (1.651 m), weight 64.9 kg, SpO2 98%. Physical Exam  Gen: Pleasant, well-nourished, in no distress,  normal affect  ENT: No lesions,  mouth clear,  oropharynx clear, no postnasal drip  Neck: No JVD, no stridor  Lungs: No use of accessory muscles, no crackles or wheezing on normal respiration, no wheeze on forced expiration  Cardiovascular: RRR, heart sounds normal, no murmur or gallops, no peripheral edema  Abdomen: soft and NT, no HSM,  BS normal  Musculoskeletal: No deformities, no cyanosis or clubbing  Neuro: alert, awake, non focal  Skin: Warm, no lesions or rashes    Assessment/Plan Robotic assisted navigational bronchoscopy today to dye-mark right middle lobe and right upper lobe pulmonary nodules.  She will then proceed to thoracic surgery for wedge resection.  Procedure discussed with the patient.  She understands the risks, benefits, rationale.  No barriers identified.  Leslye Peer, MD 06/11/2023, 7:24 AM

## 2023-06-11 NOTE — Anesthesia Procedure Notes (Signed)
Procedure Name: Intubation Date/Time: 06/11/2023 7:45 AM  Performed by: Gwenyth Allegra, CRNAPre-anesthesia Checklist: Patient identified, Emergency Drugs available, Suction available, Patient being monitored and Timeout performed Patient Re-evaluated:Patient Re-evaluated prior to induction Oxygen Delivery Method: Circle system utilized Preoxygenation: Pre-oxygenation with 100% oxygen Induction Type: IV induction Ventilation: Mask ventilation without difficulty and Oral airway inserted - appropriate to patient size Laryngoscope Size: Mac and 3 Grade View: Grade II Tube type: Oral Tube size: 8.5 mm Airway Equipment and Method: Stylet Placement Confirmation: ETT inserted through vocal cords under direct vision, positive ETCO2 and breath sounds checked- equal and bilateral Secured at: 22 cm Dental Injury: Teeth and Oropharynx as per pre-operative assessment

## 2023-06-11 NOTE — Anesthesia Procedure Notes (Addendum)
Procedure Name: Intubation Date/Time: 06/11/2023 8:48 AM  Performed by: Cy Blamer, CRNAPre-anesthesia Checklist: Patient identified, Emergency Drugs available, Suction available and Patient being monitored Patient Re-evaluated:Patient Re-evaluated prior to induction Oxygen Delivery Method: Circle system utilized Preoxygenation: Pre-oxygenation with 100% oxygen Induction Type: IV induction Ventilation: Mask ventilation without difficulty and Oral airway inserted - appropriate to patient size Laryngoscope Size: Mac and 3 Grade View: Grade II Tube type: Oral Endobronchial tube: Left, EBT position confirmed by fiberoptic bronchoscope and EBT position confirmed by auscultation and 37 Fr Number of attempts: 2 Airway Equipment and Method: Stylet, Oral airway and Fiberoptic brochoscope Placement Confirmation: ETT inserted through vocal cords under direct vision, positive ETCO2 and breath sounds checked- equal and bilateral Secured at: 29 cm Tube secured with: Tape Dental Injury: Teeth and Oropharynx as per pre-operative assessment and Bloody posterior oropharynx  Comments: Performed by Kennedy Bucker, SRNA

## 2023-06-12 ENCOUNTER — Inpatient Hospital Stay (HOSPITAL_COMMUNITY): Payer: Medicare Other

## 2023-06-12 ENCOUNTER — Encounter (HOSPITAL_COMMUNITY): Payer: Self-pay | Admitting: Thoracic Surgery (Cardiothoracic Vascular Surgery)

## 2023-06-12 LAB — BASIC METABOLIC PANEL
Anion gap: 8 (ref 5–15)
BUN: 17 mg/dL (ref 8–23)
CO2: 23 mmol/L (ref 22–32)
Calcium: 8.6 mg/dL — ABNORMAL LOW (ref 8.9–10.3)
Chloride: 106 mmol/L (ref 98–111)
Creatinine, Ser: 0.9 mg/dL (ref 0.44–1.00)
GFR, Estimated: >60 mL/min (ref >60)
Glucose, Bld: 116 mg/dL — ABNORMAL HIGH (ref 70–99)
Potassium: 4.5 mmol/L (ref 3.5–5.1)
Sodium: 137 mmol/L (ref 135–145)

## 2023-06-12 LAB — CBC
HCT: 35.1 % — ABNORMAL LOW (ref 36.0–46.0)
Hemoglobin: 11.9 g/dL — ABNORMAL LOW (ref 12.0–15.0)
MCH: 31.4 pg (ref 26.0–34.0)
MCHC: 33.9 g/dL (ref 30.0–36.0)
MCV: 92.6 fL (ref 80.0–100.0)
Platelets: 154 10*3/uL (ref 150–400)
RBC: 3.79 MIL/uL — ABNORMAL LOW (ref 3.87–5.11)
RDW: 12.6 % (ref 11.5–15.5)
WBC: 11.9 10*3/uL — ABNORMAL HIGH (ref 4.0–10.5)
nRBC: 0 % (ref 0.0–0.2)

## 2023-06-12 MED ORDER — OXYCODONE HCL 5 MG PO TABS: 5.0000 mg | ORAL_TABLET | Freq: Four times a day (QID) | ORAL | 0 refills | Status: AC | PRN

## 2023-06-12 NOTE — Anesthesia Postprocedure Evaluation (Signed)
Anesthesia Post Note  Patient: Linda Mayer  Procedure(s) Performed: ROBOTIC ASSISTED NAVIGATIONAL BRONCHOSCOPY Dye Marking     Patient location during evaluation: PACU Anesthesia Type: General Level of consciousness: sedated and patient cooperative Pain management: pain level controlled Vital Signs Assessment: post-procedure vital signs reviewed and stable Respiratory status: spontaneous breathing Cardiovascular status: stable Anesthetic complications: no   No notable events documented.  Last Vitals:  Vitals:   06/12/23 1200 06/12/23 1300  BP: (!) 95/48 (!) 112/57  Pulse: 73   Resp: 16 16  Temp:    SpO2: 90%     Last Pain:  Vitals:   06/12/23 0800  TempSrc: Oral  PainSc: 0-No pain                 Lewie Loron

## 2023-06-12 NOTE — Discharge Summary (Signed)
Physician Discharge Summary       [address] [place].[address]       [place] [address]             [phone]    Patient ID: Linda Mayer MRN: [id number] DOB/AGE: 66-Jul-1957 66 y.o.  Admit date: 06/11/2023 Discharge date: 06/12/2023  Admission Diagnoses:  Discharge Diagnoses:  Principal Problem:   Pulmonary nodules Active Problems:   Carcinoid tumor of lung   S/P partial lobectomy of lung   Consults: None  Procedure (s): Robotic assisted thoracoscopy with wedge resections of the right upper and right middle lobes.  History of Present Illness:  Linda Mayer is a 67 yo female with history of tobacco abuse and known pulmonary nodules.  She has been followed by Dr. Judeth Horn for this.  She recently underwent Navigational Bronchoscopy with biopsy of the right middle and right upper lobe nodules.  Pathology confirmed the presence of carcinoid tumor.  She was subsequently referred to Dr. Cliffton Asters for surgical resection.  At her visit she denied symptoms of weight loss, cough, and respiratory symptoms.  She continues to smoke but is willing to stop.  He recommended surgical resection, the risks and benefits of the procedure were explained to the patient and she was agreeable to proceed.  Hospital Course:    Linda Mayer presented to [facility] on 06/11/2023.  She was taken to the Endoscopy suite and underwent navigational bronchoscopy for dye marking of her nodules.  She was then taken to the operating room and underwent Right Robotic Assisted Right Upper Lobe Wedge Resection, Right Middle Lobe Wedge Resection, and Lymph Node Dissection.  She tolerated the procedure without difficulty, was extubated, and taken to the PACU in stable condition.  Postoperative hospital course:  The patient is doing well.  Chest tube was removed on postop day 1 following a clamping trial.  She has remained afebrile with stable vital signs.  She is not requiring oxygen and maintaining saturations  on room air.  She has tolerated diet advancement.  She is tolerating routine activities using standard protocols.  Incisions are noted to be healing well without evidence of infection.  She has a mild postoperative expected acute blood loss anemia.  Renal function has remained normal and she has good urine output.  Overall, at the time of discharge the patient is felt to be quite stable.    Latest Vital Signs: Blood pressure (!) 112/50, pulse 66, temperature 98.2 F (36.8 C), temperature source Oral, resp. rate 16, height 5\' 5"  (1.651 m), weight 65.2 kg, SpO2 96%.  Physical Exam: General appearance: alert, cooperative, and no distress Heart: regular rate and rhythm Lungs: clear to auscultation bilaterally Abdomen: benign Extremities: no calf tenderness or edema Wound: incis healing well Discharge Condition: Good  Recent laboratory studies:  Lab Results  Component Value Date   WBC 11.9 (H) 06/12/2023   HGB 11.9 (L) 06/12/2023   HCT 35.1 (L) 06/12/2023   MCV 92.6 06/12/2023   PLT 154 06/12/2023   Lab Results  Component Value Date   NA 137 06/12/2023   K 4.5 06/12/2023   CL 106 06/12/2023   CO2 23 06/12/2023   CREATININE 0.90 06/12/2023   GLUCOSE 116 (H) 06/12/2023      Diagnostic Studies: DG Chest Port 1 View  Result Date: 06/12/2023 CLINICAL DATA:  [id number] S/P partial lobectomy of lung [id number] EXAM: PORTABLE CHEST 1 VIEW COMPARISON:  CXR 06/11/23 FINDINGS: No pleural effusion. Compared to prior exam there is a  new right apical pneumothorax. Unchanged positioning of the right-sided thoracostomy tube. Likely slight interval increase in subcutaneous emphysema at the thoracostomy tube insertion site. No focal airspace opacity. No radiographically apparent displaced rib fractures. Visualized upper abdomen unremarkable. IMPRESSION: New right apical pneumothorax. Unchanged positioning of the right-sided thoracostomy tube. These results will be called to the ordering clinician or  representative by the Radiologist Assistant, and communication documented in the PACS or [employer]. Electronically Signed   By: Lorenza Cambridge M.D.   On: 06/12/2023 10:31   DG Chest Port 1 View  Result Date: 06/11/2023 CLINICAL DATA:  Post partial lobectomy right lung with chest tube. EXAM: PORTABLE CHEST 1 VIEW COMPARISON:  06/07/2023 FINDINGS: Lungs are adequately inflated. There is a right-sided chest tube with tip over the right apex. No definite right-sided pneumothorax visualized surgical suture line noted over the right midlung. No evidence of effusion. Left lung is clear. Cardiomediastinal silhouette and remainder the exam is unchanged. IMPRESSION: Right-sided chest tube with tip over the right apex. No definite right-sided pneumothorax. Electronically Signed   By: Elberta Fortis M.D.   On: 06/11/2023 14:31   DG C-ARM BRONCHOSCOPY  Result Date: 06/11/2023 C-ARM BRONCHOSCOPY: Fluoroscopy was utilized by the requesting physician.  No radiographic interpretation.   NM PET DOTATATE SKULL BASE TO MID THIGH  Addendum Date: 06/10/2023   ADDENDUM REPORT: 06/10/2023 09:48 ADDENDUM: Voice recognition error: Findings section: ABDOMEN/PELVIS No radiotracer avid lesions within liver.  NO abdominal mesenteric metastatic disease. No lymphadenopathy. Electronically Signed   By: Genevive Bi M.D.   On: 06/10/2023 09:48   Result Date: 06/10/2023 CLINICAL DATA:  Carcinoid lung nodules. Lung biopsy 1 month prior. Evaluation for somatostatin receptor avidity in metastatic disease. EXAM: NUCLEAR MEDICINE PET SKULL BASE TO THIGH TECHNIQUE: 4.1 mCi copper 64 DOTATATE was injected intravenously. Full-ring PET imaging was performed from the skull base to thigh after the radiotracer. CT data was obtained and used for attenuation correction and anatomic localization. COMPARISON:  CT chest 02/19/2023 FINDINGS: NECK No radiotracer activity in neck lymph nodes. Incidental CT findings: None CHEST Several pulmonary  nodules are noted in the RIGHT lung. The largest pulmonary nodule measuring 11 mm (image 75/4) in the RIGHT upper lobe has NO appreciable radiotracer activity. Smaller pulmonary nodules also have no radiotracer activity. For example 8 mm nodule at the RIGHT lung apex on image 51. No radiotracer avid mediastinal lymph nodes Incidental CT finding:None ABDOMEN/PELVIS No radiotracer avid lesions within liver. Abdominal mesenteric metastatic disease. No lymphadenopathy. Physiologic activity noted in the liver, spleen, adrenal glands and kidneys. Incidental CT findings:None SKELETON No focal activity to suggest skeletal metastasis. Incidental CT findings:None IMPRESSION: 1. RIGHT lung pulmonary nodules have no significant radiotracer activity. Pulmonary neuroendocrine tumors are known to have less somatostatin receptor avidity than GI origin carcinoid lesions. 2. No evidence of metastatic disease. Electronically Signed: By: Genevive Bi M.D. On: 06/05/2023 12:32   DG Chest 2 View  Result Date: 06/08/2023 CLINICAL DATA:  Preop for right thoracoscopy. EXAM: CHEST - 2 VIEW COMPARISON:  Chest radiograph 03/20/2023.  PET CT 05/30/2023 FINDINGS: The cardiomediastinal contours are normal. Known right middle lobe nodule is grossly stable. Pulmonary vasculature is normal. No consolidation, pleural effusion, or pneumothorax. No acute osseous abnormalities are seen. IMPRESSION: No acute chest findings. Known right middle lobe nodule is grossly stable. Electronically Signed   By: Narda Rutherford M.D.   On: 06/08/2023 08:54   Myocardial Perfusion Imaging  Result Date: 05/17/2023   The study is normal. The  study is low risk.   No ST deviation was noted.   LV perfusion is normal. There is no evidence of ischemia. There is no evidence of infarction.   Left ventricular function is normal. Nuclear stress EF: 67%. The left ventricular ejection fraction is hyperdynamic (>65%). End diastolic cavity size is normal. End systolic  cavity size is normal.   Prior study not available for comparison. Normal resting and stress perfusion. No ischemia or infarction EF 67%     Surgical pathology: Pending  Discharge Instructions     Discharge patient   Complete by: As directed    After chest tube removal, does not need a repeat chest xray   Discharge disposition: 01-Home or Self Care   Discharge patient date: 06/12/2023       Discharge Medications: Allergies as of 06/12/2023       Reactions   Zetia [ezetimibe] Diarrhea   Zocor [simvastatin]    Muscle pain         Medication List     STOP taking these medications    ciprofloxacin 500 MG tablet Commonly known as: Cipro   naproxen sodium 220 MG tablet Commonly known as: ALEVE       TAKE these medications    ALPRAZolam 0.5 MG tablet Commonly known as: XANAX Take 0.5 mg by mouth 3 (three) times daily.   aspirin 81 MG chewable tablet Chew 81 mg by mouth daily.   CALCIUM 600 + D PO Take 1 tablet by mouth 2 (two) times daily.   cholecalciferol 25 MCG (1000 UNIT) tablet Commonly known as: VITAMIN D3 Take 1,000 Units by mouth daily.   levothyroxine 88 MCG tablet Commonly known as: SYNTHROID Take 88 mcg by mouth daily.   lisinopril 10 MG tablet Commonly known as: ZESTRIL Take 10 mg by mouth daily.   mirtazapine 15 MG tablet Commonly known as: REMERON Take 15 mg by mouth at bedtime.   oxyCODONE 5 MG immediate release tablet Commonly known as: Oxy IR/ROXICODONE Take 1 tablet (5 mg total) by mouth every 6 (six) hours as needed for up to 7 days for moderate pain.   rosuvastatin 10 MG tablet Commonly known as: CRESTOR Take 10 mg by mouth 2 (two) times a week.   Stiolto Respimat 2.5-2.5 MCG/ACT Aers Generic drug: Tiotropium Bromide-Olodaterol Inhale 2 puffs into the lungs daily. What changed:  when to take this reasons to take this        Follow Up Appointments:  Signed: Noel Christmas 06/12/2023, 1:13 PM

## 2023-06-12 NOTE — Progress Notes (Addendum)
1 Day Post-Op Procedure(s) (LRB): XI ROBOTIC ASSISTED THORACOSCOPY-RIGHT UPPER LOBE AND RIGHT MIDDLE LOBE WEDGE RESECTION (Right) INTERCOSTAL NERVE BLOCK (Right) NODE DISSECTION (Right) Subjective: Had some nausea- imporved, some headache- improved  Objective: Vital signs in last 24 hours: Temp:  [97.1 F (36.2 C)-98.2 F (36.8 C)] 97.7 F (36.5 C) (09/10 0526) Pulse Rate:  [57-86] 65 (09/10 0741) Cardiac Rhythm: Normal sinus rhythm (09/10 0723) Resp:  [11-18] 16 (09/10 0741) BP: (107-147)/(41-71) 114/59 (09/10 0526) SpO2:  [93 %-100 %] 99 % (09/10 0741) Arterial Line BP: (146-175)/(58-68) 175/68 (09/09 1045) Weight:  [65.2 kg] 65.2 kg (09/09 1418)  Hemodynamic parameters for last 24 hours:    Intake/Output from previous day: 09/09 0701 - 09/10 0700 In: 1800.1 [P.O.:600; I.V.:1000; IV Piggyback:200.1] Out: 266 [Urine:90; Blood:10; Chest Tube:166] Intake/Output this shift: No intake/output data recorded.  General appearance: alert, cooperative, and no distress Heart: regular rate and rhythm Lungs: clear to auscultation bilaterally Abdomen: benign Extremities: no calf tenderness or edema Wound: incis healing well  Lab Results: Recent Labs    06/12/23 0218  WBC 11.9*  HGB 11.9*  HCT 35.1*  PLT 154   BMET:  Recent Labs    06/11/23 0845 06/12/23 0218  NA 138 137  K 4.2 4.5  CL 110 106  CO2 22 23  GLUCOSE 104* 116*  BUN 16 17  CREATININE 0.82 0.90  CALCIUM 7.9* 8.6*    PT/INR: No results for input(s): "LABPROT", "INR" in the last 72 hours. ABG No results found for: "PHART", "HCO3", "TCO2", "ACIDBASEDEF", "O2SAT" CBG (last 3)  No results for input(s): "GLUCAP" in the last 72 hours.  Meds Scheduled Meds:  sodium chloride   Intravenous Once   acetaminophen  1,000 mg Oral Q6H   Or   acetaminophen (TYLENOL) oral liquid 160 mg/5 mL  1,000 mg Oral Q6H   ALPRAZolam  0.5 mg Oral TID   arformoterol  15 mcg Nebulization BID   And   umeclidinium bromide  1  puff Inhalation Daily   aspirin  81 mg Oral Daily   bisacodyl  10 mg Oral Daily   enoxaparin (LOVENOX) injection  40 mg Subcutaneous Daily   ketorolac  15 mg Intravenous Q6H   levothyroxine  88 mcg Oral Q0600   lisinopril  10 mg Oral Daily   mirtazapine  15 mg Oral QHS   pantoprazole  40 mg Oral Daily   [START ON 06/14/2023] rosuvastatin  10 mg Oral Once per day on Monday Thursday   senna-docusate  1 tablet Oral QHS   Continuous Infusions: PRN Meds:.morphine injection, ondansetron (ZOFRAN) IV, oxyCODONE, traMADol  Xrays DG Chest Port 1 View  Result Date: 06/11/2023 CLINICAL DATA:  Post partial lobectomy right lung with chest tube. EXAM: PORTABLE CHEST 1 VIEW COMPARISON:  06/07/2023 FINDINGS: Lungs are adequately inflated. There is a right-sided chest tube with tip over the right apex. No definite right-sided pneumothorax visualized surgical suture line noted over the right midlung. No evidence of effusion. Left lung is clear. Cardiomediastinal silhouette and remainder the exam is unchanged. IMPRESSION: Right-sided chest tube with tip over the right apex. No definite right-sided pneumothorax. Electronically Signed   By: Elberta Fortis M.D.   On: 06/11/2023 14:31   DG C-ARM BRONCHOSCOPY  Result Date: 06/11/2023 C-ARM BRONCHOSCOPY: Fluoroscopy was utilized by the requesting physician.  No radiographic interpretation.    Assessment/Plan: S/P Procedure(s) (LRB): XI ROBOTIC ASSISTED THORACOSCOPY-RIGHT UPPER LOBE AND RIGHT MIDDLE LOBE WEDGE RESECTION (Right) INTERCOSTAL NERVE BLOCK (Right) NODE DISSECTION (Right)  POD#1  1 afeb, VSS, S BP 107-147, sinus rhythm 2 O2 sats good on RA 3 CT 166 cc, no air leak, minor tidaling, possible remove this morning v clamp trial first 4 voiding, not all measured, normal renal fxn 5 minor reactive leukocytosis, WBC 11.9 6 minor expected ABLA 7 CXR- small apical pntx on right 8 advance diet 9 routine pulm hygiene and rehab modalities 10 poss home later  today v tomorrow depending on course    LOS: 1 day    Rowe Clack  PA-C Pager [phone] 06/12/2023  Agree with above Clamping trial today  Corliss Skains

## 2023-06-12 NOTE — Plan of Care (Signed)
  Problem: Education: Goal: Knowledge of disease or condition will improve Outcome: Adequate for Discharge Goal: Knowledge of the prescribed therapeutic regimen will improve Outcome: Adequate for Discharge   Problem: Activity: Goal: Risk for activity intolerance will decrease Outcome: Adequate for Discharge   Problem: Cardiac: Goal: Will achieve and/or maintain hemodynamic stability Outcome: Adequate for Discharge   Problem: Clinical Measurements: Goal: Postoperative complications will be avoided or minimized Outcome: Adequate for Discharge   Problem: Respiratory: Goal: Respiratory status will improve Outcome: Adequate for Discharge   Problem: Pain Management: Goal: Pain level will decrease Outcome: Adequate for Discharge   Problem: Skin Integrity: Goal: Wound healing without signs and symptoms infection will improve Outcome: Adequate for Discharge   Problem: Education: Goal: Knowledge of General Education information will improve Description: Including pain rating scale, medication(s)/side effects and non-pharmacologic comfort measures Outcome: Adequate for Discharge   Problem: Health Behavior/Discharge Planning: Goal: Ability to manage health-related needs will improve Outcome: Adequate for Discharge   Problem: Clinical Measurements: Goal: Ability to maintain clinical measurements within normal limits will improve Outcome: Adequate for Discharge Goal: Will remain free from infection Outcome: Adequate for Discharge Goal: Diagnostic test results will improve Outcome: Adequate for Discharge Goal: Respiratory complications will improve Outcome: Adequate for Discharge Goal: Cardiovascular complication will be avoided Outcome: Adequate for Discharge   Problem: Activity: Goal: Risk for activity intolerance will decrease Outcome: Adequate for Discharge   Problem: Nutrition: Goal: Adequate nutrition will be maintained Outcome: Adequate for Discharge   Problem:  Coping: Goal: Level of anxiety will decrease Outcome: Adequate for Discharge   Problem: Elimination: Goal: Will not experience complications related to bowel motility Outcome: Adequate for Discharge Goal: Will not experience complications related to urinary retention Outcome: Adequate for Discharge   Problem: Pain Managment: Goal: General experience of comfort will improve Outcome: Adequate for Discharge   Problem: Safety: Goal: Ability to remain free from injury will improve Outcome: Adequate for Discharge   Problem: Skin Integrity: Goal: Risk for impaired skin integrity will decrease Outcome: Adequate for Discharge   

## 2023-06-12 NOTE — Plan of Care (Signed)

## 2023-06-12 NOTE — Progress Notes (Signed)
Mobility Specialist Progress Note:   06/12/23 0900  Mobility  Activity Ambulated with assistance in hallway  Level of Assistance Standby assist, set-up cues, supervision of patient - no hands on  Assistive Device None  Distance Ambulated (ft) 340 ft  Activity Response Tolerated well  Mobility Referral Yes  $Mobility charge 1 Mobility  Mobility Specialist Start Time (ACUTE ONLY) 0946  Mobility Specialist Stop Time (ACUTE ONLY) 0954  Mobility Specialist Time Calculation (min) (ACUTE ONLY) 8 min    Pre Mobility: 72 HR During Mobility: 74 HR Post Mobility:  71 HR  Pt received in bed, agreeable to mobility. C/o some fatigue nearing end of session, otherwise asymptomatic. Pt left in bed with call bell and all needs met.  D'Vante Earlene Plater Mobility Specialist Please contact via Special educational needs teacher or Rehab office at [phone]

## 2023-06-14 LAB — TYPE AND SCREEN
ABO/RH(D): A POS
Antibody Screen: NEGATIVE
Unit division: 0
Unit division: 0
Unit division: 0
Unit division: 0

## 2023-06-14 LAB — BPAM RBC
Blood Product Expiration Date: 202409152359
Blood Product Expiration Date: 202409282359
Blood Product Expiration Date: 202409282359
Blood Product Expiration Date: 202409282359
ISSUE DATE / TIME: 202409090213
ISSUE DATE / TIME: 202409090213
ISSUE DATE / TIME: 202409090811
ISSUE DATE / TIME: 202409091801
Unit Type and Rh: 6200
Unit Type and Rh: 6200
Unit Type and Rh: 6200
Unit Type and Rh: 6200

## 2023-06-17 ENCOUNTER — Encounter (HOSPITAL_COMMUNITY): Payer: Self-pay | Admitting: Emergency Medicine

## 2023-06-18 DIAGNOSIS — R918 Other nonspecific abnormal finding of lung field: Secondary | ICD-10-CM | POA: Diagnosis not present

## 2023-06-18 DIAGNOSIS — D3A090 Benign carcinoid tumor of the bronchus and lung: Secondary | ICD-10-CM | POA: Diagnosis not present

## 2023-06-18 DIAGNOSIS — I10 Essential (primary) hypertension: Secondary | ICD-10-CM | POA: Diagnosis not present

## 2023-06-18 DIAGNOSIS — Z6824 Body mass index (BMI) 24.0-24.9, adult: Secondary | ICD-10-CM | POA: Diagnosis not present

## 2023-06-19 LAB — SURGICAL PATHOLOGY

## 2023-06-20 ENCOUNTER — Telehealth: Payer: Self-pay | Admitting: Pulmonary Disease

## 2023-06-20 NOTE — Telephone Encounter (Signed)
Patient calling to get a Diagnoses form filled out showing when cancer was found

## 2023-06-27 NOTE — Telephone Encounter (Signed)
Returned call to patient Notified she can bring from by office depending on complexity there may or may not be a fee. She has a f/u scheduled next month with MH.

## 2023-06-28 ENCOUNTER — Other Ambulatory Visit: Payer: Self-pay

## 2023-06-29 NOTE — Progress Notes (Signed)
The proposed treatment discussed in conference is for discussion purpose only and is not a binding recommendation.  The patients have not been physically examined, or presented with their treatment options.  Therefore, final treatment plans cannot be decided.  

## 2023-07-05 ENCOUNTER — Other Ambulatory Visit: Payer: Self-pay | Admitting: Thoracic Surgery (Cardiothoracic Vascular Surgery)

## 2023-07-05 DIAGNOSIS — D3A090 Benign carcinoid tumor of the bronchus and lung: Secondary | ICD-10-CM

## 2023-07-06 ENCOUNTER — Encounter: Payer: Self-pay | Admitting: Thoracic Surgery (Cardiothoracic Vascular Surgery)

## 2023-07-06 ENCOUNTER — Ambulatory Visit
Admission: RE | Admit: 2023-07-06 | Discharge: 2023-07-06 | Disposition: A | Discharge status: Home / Self Care | Payer: Medicare Other | Source: Ambulatory Visit | Attending: Thoracic Surgery (Cardiothoracic Vascular Surgery)

## 2023-07-06 ENCOUNTER — Ambulatory Visit (INDEPENDENT_AMBULATORY_CARE_PROVIDER_SITE_OTHER): Payer: Self-pay | Admitting: Thoracic Surgery (Cardiothoracic Vascular Surgery)

## 2023-07-06 VITALS — BP 132/75 | HR 68 | Resp 20 | Ht 65.0 in | Wt 140.0 lb

## 2023-07-06 DIAGNOSIS — Z902 Acquired absence of lung [part of]: Secondary | ICD-10-CM

## 2023-07-06 DIAGNOSIS — D3A090 Benign carcinoid tumor of the bronchus and lung: Secondary | ICD-10-CM

## 2023-07-06 NOTE — Progress Notes (Signed)
[address]       [place] [address]             [phone]        Linda Mayer [facility] Medical Record #[id number] Date of Birth: 1955/12/20  Referring: Leslye Peer, MD Primary Care: [facility] Primary Cardiologist:Vishnu Norton Pastel, MD  Reason for visit:   follow-up  History of Present Illness:     68yo female presents for her 1st follow-up appointment.  Overall, she is doing well.  She denies any significant pain, and is only using tylenol  Physical Exam: BP 132/75 (BP Location: Right Arm, Patient Position: Sitting, Cuff Size: Normal)   Pulse 68   Resp 20   Ht 5\' 5"  (1.651 m)   Wt 140 lb (63.5 kg)   SpO2 99% Comment: RA  BMI 23.30 kg/m   Alert NAD Incision clean.   Abdomen, ND no peripheral edema   Diagnostic Studies & Laboratory data:  Path: FINAL MICROSCOPIC DIAGNOSIS:   A. LYMPH NODE, 9, EXCISION:  - Negative for tumor (0/1)   B. LYMPH NODE, 7, EXCISION:  - Negative for tumor (0/1)   C. LUNG, RIGHT UPPER LOBE, WEDGE RESECTION:  - Well-differentiated neuroendocrine tumor, grade 1  - Tumor size 0.5 cm  - Ki-67 proliferation index: Less than 1%  - Margins are negative for tumor  - No lymphovascular or pleural invasion identified  - See oncology table   D. LUNG, RIGHT MIDDLE LOBE, WEDGE RESECTION:  - Well-differentiated neuroendocrine tumor, grade 1  - Tumor size 1.8 cm  - Ki-67 proliferation index: Less than 1%  - Margins are negative for tumor  - No lymphovascular or pleural invasion identified  - See oncology table     Assessment / Plan:   67yo female s/p wedge resection of the upper and middle lobes for carcinoid tumors.  I have referred her to medical oncology for ongoing surveillance.  She will follow-up in 1 month with a CXR.   Corliss Skains 07/06/2023 12:55 PM

## 2023-07-09 ENCOUNTER — Other Ambulatory Visit: Payer: Self-pay | Admitting: Physician Assistant

## 2023-07-09 ENCOUNTER — Telehealth: Payer: Self-pay | Admitting: *Deleted

## 2023-07-09 MED ORDER — TRAMADOL HCL 50 MG PO TABS
50.0000 mg | ORAL_TABLET | Freq: Four times a day (QID) | ORAL | 0 refills | Status: AC | PRN
Start: 2023-07-09 — End: 2023-07-16

## 2023-07-09 NOTE — Telephone Encounter (Signed)
Patient contacted the office requesting a refill of pain medication. States she is taking two extra strength Tylenols and oxycodone PRN for pain. Describes pain as a shooting pain that begins under her right breast and radiates to center chest. States pain medication helps alleviate this pain. Per Gaynelle Arabian, PA, rx of tramadol sent to patient's preferred pharmacy. Patient aware.

## 2023-07-20 ENCOUNTER — Other Ambulatory Visit: Payer: Self-pay

## 2023-07-20 DIAGNOSIS — C7A090 Malignant carcinoid tumor of the bronchus and lung: Secondary | ICD-10-CM

## 2023-07-23 ENCOUNTER — Ambulatory Visit: Payer: Medicare Other | Admitting: Pulmonary Disease

## 2023-07-23 ENCOUNTER — Encounter: Payer: Self-pay | Admitting: Pulmonary Disease

## 2023-07-23 ENCOUNTER — Inpatient Hospital Stay: Payer: Medicare Other | Attending: Internal Medicine | Admitting: Internal Medicine

## 2023-07-23 ENCOUNTER — Other Ambulatory Visit: Payer: Self-pay

## 2023-07-23 ENCOUNTER — Inpatient Hospital Stay: Payer: Medicare Other

## 2023-07-23 VITALS — BP 133/75 | HR 64 | Temp 98.0°F | Resp 18 | Ht 65.0 in | Wt 146.8 lb

## 2023-07-23 VITALS — BP 128/74 | HR 65 | Temp 98.0°F | Ht 65.0 in | Wt 146.8 lb

## 2023-07-23 DIAGNOSIS — Z902 Acquired absence of lung [part of]: Secondary | ICD-10-CM | POA: Diagnosis not present

## 2023-07-23 DIAGNOSIS — I10 Essential (primary) hypertension: Secondary | ICD-10-CM

## 2023-07-23 DIAGNOSIS — D3A090 Benign carcinoid tumor of the bronchus and lung: Secondary | ICD-10-CM

## 2023-07-23 DIAGNOSIS — I714 Abdominal aortic aneurysm, without rupture, unspecified: Secondary | ICD-10-CM | POA: Diagnosis not present

## 2023-07-23 DIAGNOSIS — I739 Peripheral vascular disease, unspecified: Secondary | ICD-10-CM

## 2023-07-23 DIAGNOSIS — E039 Hypothyroidism, unspecified: Secondary | ICD-10-CM | POA: Diagnosis not present

## 2023-07-23 DIAGNOSIS — I2510 Atherosclerotic heart disease of native coronary artery without angina pectoris: Secondary | ICD-10-CM | POA: Diagnosis not present

## 2023-07-23 DIAGNOSIS — I6529 Occlusion and stenosis of unspecified carotid artery: Secondary | ICD-10-CM

## 2023-07-23 DIAGNOSIS — E785 Hyperlipidemia, unspecified: Secondary | ICD-10-CM | POA: Diagnosis not present

## 2023-07-23 DIAGNOSIS — C7A090 Malignant carcinoid tumor of the bronchus and lung: Secondary | ICD-10-CM | POA: Diagnosis not present

## 2023-07-23 DIAGNOSIS — F32A Depression, unspecified: Secondary | ICD-10-CM

## 2023-07-23 DIAGNOSIS — K219 Gastro-esophageal reflux disease without esophagitis: Secondary | ICD-10-CM | POA: Diagnosis not present

## 2023-07-23 LAB — CMP (CANCER CENTER ONLY)
ALT: 10 U/L (ref 0–44)
AST: 11 U/L — ABNORMAL LOW (ref 15–41)
Albumin: 4.3 g/dL (ref 3.5–5.0)
Alkaline Phosphatase: 62 U/L (ref 38–126)
Anion gap: 7 (ref 5–15)
BUN: 22 mg/dL (ref 8–23)
CO2: 28 mmol/L (ref 22–32)
Calcium: 9.6 mg/dL (ref 8.9–10.3)
Chloride: 106 mmol/L (ref 98–111)
Creatinine: 0.99 mg/dL (ref 0.44–1.00)
GFR, Estimated: >60 mL/min (ref >60)
Glucose, Bld: 135 mg/dL — ABNORMAL HIGH (ref 70–99)
Potassium: 4.2 mmol/L (ref 3.5–5.1)
Sodium: 141 mmol/L (ref 135–145)
Total Bilirubin: 0.4 mg/dL (ref 0.3–1.2)
Total Protein: 7.1 g/dL (ref 6.5–8.1)

## 2023-07-23 LAB — CBC WITH DIFFERENTIAL (CANCER CENTER ONLY)
Abs Immature Granulocytes: 0.03 10*3/uL (ref 0.00–0.07)
Basophils Absolute: 0.1 10*3/uL (ref 0.0–0.1)
Basophils Relative: 1 %
Eosinophils Absolute: 0.2 10*3/uL (ref 0.0–0.5)
Eosinophils Relative: 3 %
HCT: 36.9 % (ref 36.0–46.0)
Hemoglobin: 12.3 g/dL (ref 12.0–15.0)
Immature Granulocytes: 1 %
Lymphocytes Relative: 42 %
Lymphs Abs: 2.6 10*3/uL (ref 0.7–4.0)
MCH: 31.1 pg (ref 26.0–34.0)
MCHC: 33.3 g/dL (ref 30.0–36.0)
MCV: 93.2 fL (ref 80.0–100.0)
Monocytes Absolute: 0.4 10*3/uL (ref 0.1–1.0)
Monocytes Relative: 6 %
Neutro Abs: 2.9 10*3/uL (ref 1.7–7.7)
Neutrophils Relative %: 47 %
Platelet Count: 167 10*3/uL (ref 150–400)
RBC: 3.96 MIL/uL (ref 3.87–5.11)
RDW: 12.8 % (ref 11.5–15.5)
WBC Count: 6.2 10*3/uL (ref 4.0–10.5)
nRBC: 0 % (ref 0.0–0.2)

## 2023-07-23 NOTE — Progress Notes (Signed)
@Patient  ID: Linda Mayer, female    DOB: March 07, 1956, 67 y.o.   MRN: [id number]  Chief Complaint  Patient presents with   Follow-up    Pain on R side where surgery site is.  Also some nausea    Referring provider: [facility], [facility]  HPI:   67 y.o. 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  Most recent oncology note reviewed.  Most recent cardiothoracic surgery note reviewed.  Discharge summary 06/2023 for resection of carcinoid, wedge resection reviewed.  She is doing well.  Underwent surgical resection will wedge resection x 2 for carcinoid.  Following with oncology now.  Has some postthoracotomy pain at that site.  Discussed conservative management.  Discussed expect to improve over time.  She denies significant dyspnea or shortness of breath.  Stiolto samples provided at last visit 04/2023.  She has needed to use them.  Her breathing feels quite well.  Denies any significant dyspnea.  HPI at initial visit: Patient was in usual state of health.  Offered a CT coronary scan for healthcare maintenance.  Denies significant dyspnea.  This revealed some coronary calcification.  But on my review interpretation mostly notable for scattered nodules largest of which is 1.4 cm in the right middle lobe.  Rounded appearing.  She is currently smoking.  Working on cutting back.  Denies any dyspnea.  Minimal cough but sometimes a problem.  Usually in the mornings and evenings.  Throughout the day not much of a big deal.  We discussed at length the etiology of nodules.  As a pertains to her.  Whenever Mayo calculator risk.  Discussed roles including surveillance, biopsy, PET scan.  Recommended repeat CT scan with super D for possible navigational bronchoscopy, no lymphadenopathy seen on prior CT scan.  Discussed PET scan but given possible false negative and need for repeat super D CT if PET avid, decided to straight towards super D and likely biopsy if  no smaller.  If smaller can do surveillance.  If same size or enlarging will pursue biopsy.  PMH: Tobacco abuse, seasonal allergies, hyperlipidemia, hypertension Surgical history: Reviewed with patient, she denies any Family history: First-degree relatives with allergies, no significant respiratory disease Social history: Current smoker, down to close to half a pack, 50+ pack years, lives in [place] / Pulmonary Flowsheets:   ACT:      No data to display          MMRC:     No data to display          Epworth:      No data to display          Tests:   FENO:  No results found for: "NITRICOXIDE"  PFT:    Latest Ref Rng & Units 04/16/2023    2:43 PM  PFT Results  FVC-Pre L 2.62   FVC-Predicted Pre % 79   FVC-Post L 2.63   FVC-Predicted Post % 79   Pre FEV1/FVC % % 75   Post FEV1/FCV % % 77   FEV1-Pre L 1.97   FEV1-Predicted Pre % 78   FEV1-Post L 2.01   DLCO uncorrected ml/min/mmHg 22.73   DLCO UNC% % 110   DLCO corrected ml/min/mmHg 22.52   DLCO COR %Predicted % 109   DLVA Predicted % 117   TLC L 5.58   TLC % Predicted % 107   RV % Predicted % 134   Personally reviewed  interpret as normal spirometry.  No bronchodilator response.  Lung volumes consistent with air trapping.  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 Elevation of the right hemidiaphragm status post right middle lobectomy. No pneumothorax. No acute airspace opacity. The visualized skeletal structures are unremarkable. IMPRESSION: Elevation of the right hemidiaphragm status post right middle lobectomy. No pneumothorax. No acute airspace opacity.  Electronically Signed   By: Jearld Lesch M.D.   On: 07/06/2023 08:49    Lab Results: Personally reviewed and as per EMR CBC    Component Value Date/Time   WBC 6.2 07/23/2023 1338   WBC 11.9 (H) 06/12/2023 0218   RBC 3.96 07/23/2023 1338   HGB 12.3 07/23/2023 1338   HCT 36.9 07/23/2023 1338   PLT 167 07/23/2023 1338   MCV 93.2 07/23/2023 1338   MCH 31.1 07/23/2023 1338   MCHC 33.3 07/23/2023 1338   RDW 12.8 07/23/2023 1338   LYMPHSABS 2.6 07/23/2023 1338   MONOABS 0.4 07/23/2023 1338   EOSABS 0.2 07/23/2023 1338   BASOSABS 0.1 07/23/2023 1338    BMET    Component Value Date/Time   NA 141 07/23/2023 1338   K 4.2 07/23/2023 1338   CL 106 07/23/2023 1338   CO2 28 07/23/2023 1338   GLUCOSE 135 (H) 07/23/2023 1338   BUN 22 07/23/2023 1338   CREATININE 0.99 07/23/2023 1338   CALCIUM 9.6 07/23/2023 1338   GFRNONAA >60 07/23/2023 1338   GFRAA >60 05/13/2016 0541    BNP No results found for: "BNP"  ProBNP No results found for: "PROBNP"  Specialty Problems       Pulmonary Problems   Pulmonary nodules   Carcinoid tumor of lung    Allergies  Allergen Reactions   Zetia [Ezetimibe] Diarrhea   Zocor [Simvastatin]     Muscle pain      There is no immunization history on file for this patient.  Past Medical History:  Diagnosis Date   AAA (abdominal aortic aneurysm) (HCC)    Anxiety    Carcinoid tumor of right lung    Complication of anesthesia    Pt states that her BP drops really low after anesthesia. Hard time waking up   Coronary artery disease    Depression    situational   Dizziness    GERD (gastroesophageal reflux disease)    Headache    Hypertension    Hypothyroidism    Peripheral vascular disease (HCC)     Tobacco History: Social History   Tobacco Use  Smoking Status Former   Current packs/day: 0.00   Average packs/day: 0.5 packs/day for 50.0 years (25.0 ttl pk-yrs)   Types: Cigarettes   Quit date: 05/24/2023   Years since quitting: 0.1   Smokeless Tobacco Never   Counseling given: Not Answered   Continue to not smoke  Outpatient Encounter Medications as of 07/23/2023  Medication Sig   ALPRAZolam (XANAX) 0.5 MG tablet Take 0.5 mg by mouth 3 (three) times daily.   aspirin 81 MG chewable tablet Chew 81 mg by mouth daily.   Calcium Carb-Cholecalciferol (CALCIUM 600 + D PO) Take 1 tablet  by mouth 2 (two) times daily.   cholecalciferol (VITAMIN D3) 25 MCG (1000 UNIT) tablet Take 1,000 Units by mouth daily.   levothyroxine (SYNTHROID) 88 MCG tablet Take 88 mcg by mouth daily.   lisinopril (PRINIVIL,ZESTRIL) 10 MG tablet Take 10 mg by mouth daily.   mirtazapine (REMERON) 15 MG tablet Take 15 mg by mouth at bedtime.   rosuvastatin (CRESTOR) 10 MG tablet Take 10 mg by mouth 2 (two) times a week.   traMADol (ULTRAM) 50 MG tablet Take 50 mg by mouth every 6 (six) hours as needed for moderate pain (pain score 4-6).   [DISCONTINUED] Tiotropium Bromide-Olodaterol (STIOLTO RESPIMAT) 2.5-2.5 MCG/ACT AERS Inhale 2 puffs into the lungs daily. (Patient taking differently: Inhale 2 puffs into the lungs daily as needed (shortness of breath).)   No facility-administered encounter medications on file as of 07/23/2023.     Review of Systems  Review of Systems  No chest pain with exertion.  No orthopnea or PND.  Comprehensive review of systems otherwise negative. Physical Exam  BP 128/74 (BP Location: Right Arm, Patient Position: Sitting, Cuff Size: Normal)   Pulse 65   Temp 98 F (36.7 C) (Oral)   Ht 5\' 5"  (1.651 m)   Wt 146 lb 12.8 oz (66.6 kg)   SpO2 98%   BMI 24.43 kg/m   Wt Readings from Last 5 Encounters:  07/23/23 146 lb 12.8 oz (66.6 kg)  07/23/23 146 lb 12.8 oz (66.6 kg)  07/06/23 140 lb (63.5 kg)  06/11/23 143 lb 11.8 oz (65.2 kg)  06/07/23 143 lb 4.8 oz (65 kg)    BMI Readings from Last 5 Encounters:  07/23/23 24.43 kg/m  07/23/23 24.43 kg/m  07/06/23 23.30 kg/m  06/11/23 23.92 kg/m  06/07/23 23.85 kg/m      Physical Exam General: Sitting in chair, no acute distress Eyes: EOMI, no icterus Neck: Supple, no JVP Pulmonary: Distant, clear Cardiovascular: Warm, no edema Abdomen: Nondistended, bowel sounds present MSK: No synovitis, no joint effusion Neuro: Normal gait, no weakness Psych: Normal mood, full affect   Assessment & Plan:   Carcinoid tumor of the lung: 2 on the right.  Biopsy via navigational bronchoscopy.  Surgically resected 06/2023, wedge resection x 2.  Undergoing surveillance via oncology.  Dyspnea on exertion: PFTs largely normal with air trapping.  Air trapping certainly can cause dyspnea.  Stiolto prescribed in the past, she is not using.  She feels her dyspnea is resolved.  No issues.  Stiolto removed from medication list.  Tobacco abuse in remission: She is quit smoking.  She is congratulated on abstinence.  Ongoing abstinence encouraged.   Return if symptoms worsen or fail to improve.   Karren Burly, MD 07/23/2023

## 2023-07-23 NOTE — Progress Notes (Signed)
[facility] [phone]   [phone]  CONSULT NOTE  REFERRING PHYSICIAN: Dr. Brynda Greathouse  REASON FOR CONSULTATION:  67 years old white female recently diagnosed with carcinoid tumor.  HPI Linda Mayer is a 67 y.o. female.  Discussed the use of AI scribe software for clinical note transcription with the patient, who gave verbal consent to proceed.  History of Present Illness   The patient, a 67 year old individual with a recent diagnosis of carcinoid tumor of the lung, presents for a first-time consultation. The diagnosis was incidentally discovered during a routine health check in February of the current year. The primary care physician noted bruits in the carotid arteries, leading to an ultrasound that revealed 50-60% blockage. A subsequent heart specialist visit included a coronary calcium score test, which revealed nodules in the lungs.  The patient was referred to a pulmonologist, who confirmed the presence of bilateral lung nodules, primarily on the right side, via a CT scan in May. A bronchoscopy performed in June revealed a well-differentiated neuroendocrine carcinoma, carcinoid tumor. A PET CT scan in August showed no tracer activity in the nodules, consistent with the slow-growing nature of carcinoid tumors.  In September, the patient underwent surgery, with wedge resections of the right upper and middle lobes. Pathology confirmed the presence of well-differentiated neuroendocrine carcinoma, carcinoid tumors in both lobes, measuring 0.5 cm and 1.8 cm respectively.  Postoperatively, the patient reports feeling generally well, with initial fatigue improving with rest and activity. However, she experiences sharp, radiating pain on the right side, from the back to the front, with associated numbness. This pain also presents in the middle of the chest, under the right breast area. The patient is currently on Tramadol 50 mg for pain management,  which provides some relief.  The patient also reports occasional nausea but denies diarrhea, hot flashes, chest pain, shortness of breath, and cough. There has been no weight loss; in fact, the patient reports weight gain due to increased food intake. The patient has a history of carotid stenosis, abdominal aortic aneurysm, coronary artery disease, depression, acid reflux, hypertension, hypothyroidism, peripheral vascular disease, and high cholesterol. She has a 50-year history of smoking, quitting recently in August of the current year. The patient denies alcohol use and illicit drug use. She is allergic to Zetia and Zocor, which cause muscle pain. The patient is adopted and has limited knowledge of family medical history, but reports that her adopted brother has heart disease.      HPI  Past Medical History:  Diagnosis Date   AAA (abdominal aortic aneurysm) (HCC)    Anxiety    Carcinoid tumor of right lung    Complication of anesthesia    Pt states that her BP drops really low after anesthesia. Hard time waking up   Coronary artery disease    Depression    situational   Dizziness    GERD (gastroesophageal reflux disease)    Headache    Hypertension    Hypothyroidism    Peripheral vascular disease (HCC)     Past Surgical History:  Procedure Laterality Date   ABDOMINAL HYSTERECTOMY     BARTHOLIN CYST MARSUPIALIZATION     Removal   BRONCHIAL BIOPSY  03/20/2023   Procedure: BRONCHIAL BIOPSIES;  Surgeon: Leslye Peer, MD;  Location: [facility] ENDOSCOPY;  Service: Pulmonary;;   BRONCHIAL BRUSHINGS  03/20/2023   Procedure: BRONCHIAL BRUSHINGS;  Surgeon: Leslye Peer, MD;  Location: [facility] ENDOSCOPY;  Service: Pulmonary;;  BRONCHIAL NEEDLE ASPIRATION BIOPSY  03/20/2023   Procedure: BRONCHIAL NEEDLE ASPIRATION BIOPSIES;  Surgeon: Leslye Peer, MD;  Location: [facility] ENDOSCOPY;  Service: Pulmonary;;   BRONCHIAL WASHINGS  03/20/2023   Procedure: BRONCHIAL WASHINGS;  Surgeon: Leslye Peer, MD;   Location: [facility] ENDOSCOPY;  Service: Pulmonary;;   FIDUCIAL MARKER PLACEMENT  06/11/2023   Procedure: Bartolo Darter Marking;  Surgeon: Leslye Peer, MD;  Location: [facility] ENDOSCOPY;  Service: Pulmonary;;   INTERCOSTAL NERVE BLOCK Right 06/11/2023   Procedure: INTERCOSTAL NERVE BLOCK;  Surgeon: Corliss Skains, MD;  Location: [facility] OR;  Service: Thoracic;  Laterality: Right;   NODE DISSECTION Right 06/11/2023   Procedure: NODE DISSECTION;  Surgeon: Corliss Skains, MD;  Location: [facility] OR;  Service: Thoracic;  Laterality: Right;   THYROIDECTOMY N/A 05/12/2016   Procedure: THYROIDECTOMY;  Surgeon: Franky Macho, MD;  Location: [facility] ORS;  Service: General;  Laterality: N/A;   VESICOVAGINAL FISTULA CLOSURE W/ TAH      Family History  Problem Relation Age of Onset   Heart disease Brother    Thyroid disease Brother    Hypotension Brother     Social History Social History   Tobacco Use   Smoking status: Former    Current packs/day: 0.00    Average packs/day: 0.5 packs/day for 50.0 years (25.0 ttl pk-yrs)    Types: Cigarettes    Quit date: 05/24/2023    Years since quitting: 0.1   Smokeless tobacco: Never  Vaping Use   Vaping status: Never Used  Substance Use Topics   Alcohol use: No   Drug use: No    Allergies  Allergen Reactions   Zetia [Ezetimibe] Diarrhea   Zocor [Simvastatin]     Muscle pain     Current Outpatient Medications  Medication Sig Dispense Refill   ALPRAZolam (XANAX) 0.5 MG tablet Take 0.5 mg by mouth 3 (three) times daily.     aspirin 81 MG chewable tablet Chew 81 mg by mouth daily.     Calcium Carb-Cholecalciferol (CALCIUM 600 + D PO) Take 1 tablet by mouth 2 (two) times daily.     cholecalciferol (VITAMIN D3) 25 MCG (1000 UNIT) tablet Take 1,000 Units by mouth daily.     levothyroxine (SYNTHROID) 88 MCG tablet Take 88 mcg by mouth daily.     lisinopril (PRINIVIL,ZESTRIL) 10 MG tablet Take 10 mg by mouth daily.     mirtazapine (REMERON) 15 MG tablet Take 15 mg by mouth at  bedtime.     rosuvastatin (CRESTOR) 10 MG tablet Take 10 mg by mouth 2 (two) times a week.     Tiotropium Bromide-Olodaterol (STIOLTO RESPIMAT) 2.5-2.5 MCG/ACT AERS Inhale 2 puffs into the lungs daily. (Patient taking differently: Inhale 2 puffs into the lungs daily as needed (shortness of breath).) 8 g 0   No current facility-administered medications for this visit.    Review of Systems  Constitutional: negative Eyes: negative Ears, nose, mouth, throat, and face: negative Respiratory: positive for pleurisy/chest pain Cardiovascular: negative Gastrointestinal: negative Genitourinary:negative Integument/breast: negative Hematologic/lymphatic: negative Musculoskeletal:negative Neurological: negative Behavioral/Psych: negative Endocrine: negative Allergic/Immunologic: negative  Physical Exam  WNI:OEVOJ, healthy, no distress, well nourished, and well developed SKIN: skin color, texture, turgor are normal, no rashes or significant lesions HEAD: Normocephalic, No masses, lesions, tenderness or abnormalities EYES: normal, PERRLA, Conjunctiva are pink and non-injected EARS: External ears normal, Canals clear OROPHARYNX:no exudate, no erythema, and lips, buccal mucosa, and tongue normal  NECK: supple, no adenopathy, no JVD LYMPH:  no palpable lymphadenopathy, no  hepatosplenomegaly BREAST:not examined LUNGS: clear to auscultation , and palpation HEART: regular rate & rhythm, no murmurs, and no gallops ABDOMEN:abdomen soft, non-tender, normal bowel sounds, and no masses or organomegaly BACK: Back symmetric, no curvature., No CVA tenderness EXTREMITIES:no joint deformities, effusion, or inflammation, no edema  NEURO: alert & oriented x 3 with fluent speech, no focal motor/sensory deficits  PERFORMANCE STATUS: ECOG 1  LABORATORY DATA: Lab Results  Component Value Date   WBC 6.2 07/23/2023   HGB 12.3 07/23/2023   HCT 36.9 07/23/2023   MCV 93.2 07/23/2023   PLT 167 07/23/2023       Chemistry      Component Value Date/Time   NA 141 07/23/2023 1338   K 4.2 07/23/2023 1338   CL 106 07/23/2023 1338   CO2 28 07/23/2023 1338   BUN 22 07/23/2023 1338   CREATININE 0.99 07/23/2023 1338      Component Value Date/Time   CALCIUM 9.6 07/23/2023 1338   ALKPHOS 62 07/23/2023 1338   AST 11 (L) 07/23/2023 1338   ALT 10 07/23/2023 1338   BILITOT 0.4 07/23/2023 1338       RADIOGRAPHIC STUDIES: DG Chest 2 View  Result Date: 07/06/2023 CLINICAL DATA:  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 Elevation of the right hemidiaphragm status post right middle lobectomy. No pneumothorax. No acute airspace opacity. The visualized skeletal structures are unremarkable. IMPRESSION: Elevation of the right hemidiaphragm status post right middle lobectomy. No pneumothorax. No acute airspace opacity. Electronically Signed   By: Jearld Lesch M.D.   On: 07/06/2023 08:49    ASSESSMENT: This is a very pleasant 67 years old white female diagnosed with multifocal carcinoid tumor presented with stage Ia (T1a, N0, M0) well-differentiated neuroendocrine carcinoma, carcinoid tumor involving the right upper lobe as well as stage Ia (T1b, N0, M0) well-differentiated neuroendocrine carcinoma, carcinoid tumor involving the right middle lobe in addition to few other pulmonary nodules diagnosed in June 2024 status post wedge resection of the right upper lobe as well as right middle lobe on June 11, 2023 under the care of Dr. Cliffton Asters.   PLAN: I had a lengthy discussion with the patient today about her current condition and treatment options. I personally and independently reviewed her imaging studies as well as the pathology report.    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 Slow-growing nodules remain in the lung, not causing obstruction or respiratory issues. No need for chemotherapy or radiation at this time. -Schedule follow-up CT scan in 6 months to monitor nodules. -If stable at 6 months, transition to annual follow-up.  Post-Surgical Pain Reports sharp, radiating pain on the right side, likely due to nerve damage from recent surgery. Currently on Tramadol 50mg  for pain management. -Consider addition of Gabapentin or Lyrica for neuropathic pain if not improved.  Carotid Stenosis Diagnosed with 50-60% blockage in carotid arteries. No current intervention mentioned in the conversation. -Continue monitoring as per primary care physician or vascular specialist's plan.  Abdominal Aortic Aneurysm, Coronary Artery Disease, Depression, Acid Reflux, Hypertension, Hypothyroidism, Peripheral Vascular Disease, and Hypercholesterolemia Chronic conditions mentioned but not discussed in detail during the conversation. -Continue current management as per primary care physician or respective specialists' plans.   The patient was advised to call immediately if she has any other concerning symptoms in  the interval.  The patient voices understanding of current disease status and treatment options and is in agreement with the current care plan.  All questions were answered. The patient knows to call the clinic with any problems, questions or concerns. We can certainly see the patient much sooner if necessary.  Thank you so much for allowing me to participate in the care of Linda Mayer. I will continue to follow up the patient with you and assist in her care.  The total time spent in the appointment was 60 minutes.  Disclaimer: This note was dictated with voice recognition software. Similar sounding words can inadvertently be transcribed and may not be corrected upon review.   Lajuana Matte July 23, 2023, 2:30 PM

## 2023-07-23 NOTE — Patient Instructions (Signed)
Nice to see you again  I am glad you are doing well  I expect that pain at the surgical site will improve over time, give another 2 to 3 months.  Sometimes you need to use a nerve medicine to treat neuropathy at that area.  Damage to the nerves are often the cause of the pain.  You can try Salonpas lidocaine patches, these are over-the-counter.  You can place over the affected area for 12 hours then remove.  Do not use more than 12 hours at a time.  Return to clinic as needed

## 2023-07-30 NOTE — Progress Notes (Signed)
      [address]       [place] [address]             [phone]       HPI: Patient returns for routine postoperative follow-up having undergone Robotic Right Upper Lobe/Right Middle Lobe Wedge Resection on 06/11/2023. The patient's early postoperative recovery while in the hospital was unremarkable.   Since hospital discharge the patient reports she is doing very well.  She went on a 2 mile hike last weekend.  She is back to working part time.  In regards to pain she is still using Tramadol very sparingly.  She states that she will get sharp jolts every once in awhile.  Incisions are otherwise healing without complaints.  Current Outpatient Medications  Medication Sig Dispense Refill   ALPRAZolam (XANAX) 0.5 MG tablet Take 0.5 mg by mouth 3 (three) times daily.     aspirin 81 MG chewable tablet Chew 81 mg by mouth daily.     Calcium Carb-Cholecalciferol (CALCIUM 600 + D PO) Take 1 tablet by mouth 2 (two) times daily.     cholecalciferol (VITAMIN D3) 25 MCG (1000 UNIT) tablet Take 1,000 Units by mouth daily.     levothyroxine (SYNTHROID) 88 MCG tablet Take 88 mcg by mouth daily.     lisinopril (PRINIVIL,ZESTRIL) 10 MG tablet Take 10 mg by mouth daily.     mirtazapine (REMERON) 15 MG tablet Take 15 mg by mouth at bedtime.     rosuvastatin (CRESTOR) 10 MG tablet Take 10 mg by mouth 2 (two) times a week.     traMADol (ULTRAM) 50 MG tablet Take 50 mg by mouth every 6 (six) hours as needed for moderate pain (pain score 4-6).     No current facility-administered medications for this visit.    Physical Exam: BP 117/62 (BP Location: Right Arm, Patient Position: Sitting, Cuff Size: Normal)   Pulse 69   Resp 20   Ht 5\' 5"  (1.651 m)   Wt 145 lb 9.6 oz (66 kg)   SpO2 95% Comment: RA  BMI 24.23 kg/m   Gen: NAD Heart: RRR  Lungs: CTA bilaterally Incisions: well healed  Diagnostic Tests:  CXR: no pleural effusions, no pneumothorax, stable elevation of right  hemi-diaphragm  A/P:  S/P Robotic Assisted Right Upper Lobectomy and Right Middle Lobe Wedge Resection Stage 1A Carcinoid Tumor- further surveillance by Dr. Shirline Frees Thoracic pain- she describes neuropathy to me, this should resolve with time.. however if it persists we could try Gabapentin nightly to see if she gets relief.. patient wished to hold off until she was no longer on tramadol and will contact office should pain persist RTC prn  Lowella Dandy, PA-C [facility] [phone]

## 2023-08-08 ENCOUNTER — Other Ambulatory Visit: Payer: Self-pay | Admitting: Thoracic Surgery (Cardiothoracic Vascular Surgery)

## 2023-08-08 DIAGNOSIS — D3A090 Benign carcinoid tumor of the bronchus and lung: Secondary | ICD-10-CM

## 2023-08-09 ENCOUNTER — Ambulatory Visit (INDEPENDENT_AMBULATORY_CARE_PROVIDER_SITE_OTHER): Payer: Self-pay | Admitting: Physician Assistant

## 2023-08-09 ENCOUNTER — Ambulatory Visit
Admission: RE | Admit: 2023-08-09 | Discharge: 2023-08-09 | Disposition: A | Discharge status: Home / Self Care | Payer: Medicare Other | Source: Ambulatory Visit | Attending: Thoracic Surgery (Cardiothoracic Vascular Surgery) | Admitting: Thoracic Surgery (Cardiothoracic Vascular Surgery)

## 2023-08-09 VITALS — BP 117/62 | HR 69 | Resp 20 | Ht 65.0 in | Wt 145.6 lb

## 2023-08-09 DIAGNOSIS — Z902 Acquired absence of lung [part of]: Secondary | ICD-10-CM | POA: Diagnosis not present

## 2023-08-09 DIAGNOSIS — D3A090 Benign carcinoid tumor of the bronchus and lung: Secondary | ICD-10-CM

## 2023-09-13 DIAGNOSIS — D3A090 Benign carcinoid tumor of the bronchus and lung: Secondary | ICD-10-CM | POA: Diagnosis not present

## 2023-09-13 DIAGNOSIS — I10 Essential (primary) hypertension: Secondary | ICD-10-CM | POA: Diagnosis not present

## 2023-10-16 ENCOUNTER — Ambulatory Visit: Payer: Medicare Other | Admitting: Vascular Surgery

## 2023-10-16 ENCOUNTER — Encounter: Payer: Self-pay | Admitting: Vascular Surgery

## 2023-10-16 ENCOUNTER — Ambulatory Visit (INDEPENDENT_AMBULATORY_CARE_PROVIDER_SITE_OTHER): Payer: Medicare Other

## 2023-10-16 VITALS — BP 118/78 | HR 65 | Ht 65.0 in | Wt 150.0 lb

## 2023-10-16 DIAGNOSIS — I6523 Occlusion and stenosis of bilateral carotid arteries: Secondary | ICD-10-CM

## 2023-10-16 NOTE — Progress Notes (Signed)
 [facility] [place]  ASSESSMENT / PLAN: Linda Mayer is a 68 y.o. female with: # infrarenal abdominal aortic aneurysm measuring 31mm.  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  Recommend:  Abstinence from all tobacco products. Blood glucose control with goal A1c < 7%. Blood pressure control with goal blood pressure < 140/90 mmHg. Lipid reduction therapy with goal LDL-C <100 mg/dL.  Aspirin  81mg  PO QD.  Atorvastatin 40-80mg  PO QD (or other high intensity statin therapy).  Continue surveillance of carotid stenosis with duplex in 6 months. Recheck AAA with duplex in 3 years (2027).   CHIEF COMPLAINT: follow up carotid stenosis  HISTORY OF PRESENT ILLNESS: Linda Mayer is a 68 y.o. female who has previously seen my partner, Dr. Oris, for carotid artery stenosis. This was asymptomatic when first discovered. The patient's primary doctor identified a bruit, and went on to discover the stenosis. The patient continues to have no symptoms from a neurologic perspective.  She denies amaurosis, facial weakness, difficulty speaking, unilateral weakness.  She is working hard on quitting smoking.  She is compliant with best medical therapy for atherosclerotic disease.  10/16/23: Doing well overall. No stroke or TIA symptoms. Reviewed duplex.   Past Medical History:  Diagnosis Date   AAA (abdominal aortic aneurysm) (HCC)    Anxiety    Carcinoid tumor of right lung    Complication of anesthesia    Pt states that her BP drops really low after anesthesia. Hard time waking up   Coronary artery disease    Depression    situational   Dizziness    GERD (gastroesophageal reflux disease)    Headache    Hypertension    Hypothyroidism    Peripheral vascular disease (HCC)     Past Surgical History:  Procedure Laterality Date   ABDOMINAL HYSTERECTOMY     BARTHOLIN CYST MARSUPIALIZATION     Removal   BRONCHIAL BIOPSY  03/20/2023   Procedure: BRONCHIAL  BIOPSIES;  Surgeon: Shelah Lamar RAMAN, MD;  Location: [facility] ENDOSCOPY;  Service: Pulmonary;;   BRONCHIAL BRUSHINGS  03/20/2023   Procedure: BRONCHIAL BRUSHINGS;  Surgeon: Shelah Lamar RAMAN, MD;  Location[facility] ENDOSCOPY;  Service: Pulmonary;;   BRONCHIAL NEEDLE ASPIRATION BIOPSY  03/20/2023   Procedure: BRONCHIAL NEEDLE ASPIRATION BIOPSIES;  Surgeon: Shelah Lamar RAMAN, MD;  Location: [facility] ENDOSCOPY;  Service: Pulmonary;;   BRONCHIAL WASHINGS  03/20/2023   Procedure: BRONCHIAL WASHINGS;  Surgeon: Shelah Lamar RAMAN, MD;  Location: [facility] ENDOSCOPY;  Service: Pulmonary;;   FIDUCIAL MARKER PLACEMENT  06/11/2023   Procedure: Zelphia Marking;  Surgeon: Shelah Lamar RAMAN, MD;  Location: [facility] ENDOSCOPY;  Service: Pulmonary;;   INTERCOSTAL NERVE BLOCK Right 06/11/2023   Procedure: INTERCOSTAL NERVE BLOCK;  Surgeon: Shyrl Linnie KIDD, MD;  Location: [facility] OR;  Service: Thoracic;  Laterality: Right;   NODE DISSECTION Right 06/11/2023   Procedure: NODE DISSECTION;  Surgeon: Shyrl Linnie KIDD, MD;  Location: [facility] OR;  Service: Thoracic;  Laterality: Right;   THYROIDECTOMY N/A 05/12/2016   Procedure: THYROIDECTOMY;  Surgeon: Oneil Budge, MD;  Location: [facility] ORS;  Service: General;  Laterality: N/A;   VESICOVAGINAL FISTULA CLOSURE W/ TAH      Family History  Problem Relation Age of Onset   Heart disease Brother    Thyroid  disease Brother    Hypotension Brother     Social History   Socioeconomic History   Marital status: Married    Spouse name: Not on file   Number of children: 2  Years of education: Not on file   Highest education level: Not on file  Occupational History   Not on file  Tobacco Use   Smoking status: Former    Current packs/day: 0.00    Average packs/day: 0.5 packs/day for 50.0 years (25.0 ttl pk-yrs)    Types: Cigarettes    Quit date: 05/24/2023    Years since quitting: 0.3   Smokeless tobacco: Never  Vaping Use   Vaping status: Never Used  Substance and Sexual Activity   Alcohol use: No   Drug use: No    Sexual activity: Not on file  Other Topics Concern   Not on file  Social History Narrative   Not on file   Social Drivers of Health   Financial Resource Strain: Not on file  Food Insecurity: No Food Insecurity (06/11/2023)   Hunger Vital Sign    Worried About Running Out of Food in the Last Year: Never true    Ran Out of Food in the Last Year: Never true  Transportation Needs: No Transportation Needs (06/11/2023)   PRAPARE - Administrator, Civil Service (Medical): No    Lack of Transportation (Non-Medical): No  Physical Activity: Not on file  Stress: Not on file  Social Connections: Not on file  Intimate Partner Violence: Not At Risk (06/11/2023)   Humiliation, Afraid, Rape, and Kick questionnaire    Fear of Current or Ex-Partner: No    Emotionally Abused: No    Physically Abused: No    Sexually Abused: No    Allergies  Allergen Reactions   Zetia [Ezetimibe] Diarrhea   Zocor [Simvastatin]     Muscle pain     Current Outpatient Medications  Medication Sig Dispense Refill   ALPRAZolam  (XANAX ) 0.5 MG tablet Take 0.5 mg by mouth 3 (three) times daily.     aspirin  81 MG chewable tablet Chew 81 mg by mouth daily.     Calcium  Carb-Cholecalciferol (CALCIUM  600 + D PO) Take 1 tablet by mouth 2 (two) times daily.     cholecalciferol (VITAMIN D3) 25 MCG (1000 UNIT) tablet Take 1,000 Units by mouth daily.     levothyroxine  (SYNTHROID ) 88 MCG tablet Take 88 mcg by mouth daily.     lisinopril  (PRINIVIL ,ZESTRIL ) 10 MG tablet Take 10 mg by mouth daily.     mirtazapine  (REMERON ) 15 MG tablet Take 15 mg by mouth at bedtime.     rosuvastatin  (CRESTOR ) 10 MG tablet Take 10 mg by mouth 2 (two) times a week.     traMADol  (ULTRAM ) 50 MG tablet Take 50 mg by mouth every 6 (six) hours as needed for moderate pain (pain score 4-6).     No current facility-administered medications for this visit.    PHYSICAL EXAM Vitals:   10/16/23 1259 10/16/23 1301  BP: 124/79 118/78  Pulse: 67  65  Weight: 150 lb (68 kg)   Height: 5' 5 (1.651 m)     Middle-age woman in no acute distress Regular rate and rhythm Unlabored breathing No focal neurologic deficits   PERTINENT LABORATORY AND RADIOLOGIC DATA  Most recent CBC    Latest Ref Rng & Units 07/23/2023    1:38 PM 06/12/2023    2:18 AM 06/07/2023   11:30 AM  CBC  WBC 4.0 - 10.5 K/uL 6.2  11.9  7.1   Hemoglobin 12.0 - 15.0 g/dL 87.6  88.0  86.2   Hematocrit 36.0 - 46.0 % 36.9  35.1  40.8   Platelets 150 -  400 K/uL 167  154  159      Most recent CMP    Latest Ref Rng & Units 07/23/2023    1:38 PM 06/12/2023    2:18 AM 06/11/2023    8:45 AM  CMP  Glucose 70 - 99 mg/dL 864  883  895   BUN 8 - 23 mg/dL 22  17  16    Creatinine 0.44 - 1.00 mg/dL 9.00  9.09  9.17   Sodium 135 - 145 mmol/L 141  137  138   Potassium 3.5 - 5.1 mmol/L 4.2  4.5  4.2   Chloride 98 - 111 mmol/L 106  106  110   CO2 22 - 32 mmol/L 28  23  22    Calcium  8.9 - 10.3 mg/dL 9.6  8.6  7.9   Total Protein 6.5 - 8.1 g/dL 7.1   5.4   Total Bilirubin 0.3 - 1.2 mg/dL 0.4   0.7   Alkaline Phos 38 - 126 U/L 62   52   AST 15 - 41 U/L 11   16   ALT 0 - 44 U/L 10   12    Carotid duplex 10/16/2023  Right Carotid: Velocities in the right ICA are consistent with a 60-79%                 stenosis.   Left Carotid: Velocities in the left ICA are consistent with a 40-59%  stenosis.   Vertebrals: Bilateral vertebral arteries demonstrate antegrade flow.  Proximal              left vertebral artery appears stenotic.  Subclavians: Right subclavian artery flow was disturbed. Normal flow               hemodynamics were seen in the left subclavian artery.    Debby SAILOR. Magda, MD [facility] Phone Number: [phone] 10/16/2023 1:54 PM   Total time spent on preparing this encounter including chart review, data review, collecting history, examining the patient, coordinating care for this established patient, 30  minutes.  Portions of this report may have been transcribed using voice recognition software.  Every effort has been made to ensure accuracy; however, inadvertent computerized transcription errors may still be present.

## 2023-10-23 ENCOUNTER — Encounter (HOSPITAL_COMMUNITY): Payer: Medicare Other

## 2023-10-23 ENCOUNTER — Other Ambulatory Visit: Payer: Self-pay

## 2023-10-23 ENCOUNTER — Ambulatory Visit: Payer: Medicare Other | Admitting: Vascular Surgery

## 2023-10-23 DIAGNOSIS — I6523 Occlusion and stenosis of bilateral carotid arteries: Secondary | ICD-10-CM

## 2023-11-05 ENCOUNTER — Ambulatory Visit: Payer: Medicare Other | Admitting: Internal Medicine

## 2023-11-08 ENCOUNTER — Encounter: Payer: Self-pay | Admitting: Internal Medicine

## 2023-11-08 ENCOUNTER — Ambulatory Visit: Payer: Medicare Other | Attending: Internal Medicine | Admitting: Internal Medicine

## 2023-11-08 ENCOUNTER — Telehealth: Payer: Self-pay | Admitting: Internal Medicine

## 2023-11-08 VITALS — BP 128/80 | HR 72 | Ht 65.0 in | Wt 153.0 lb

## 2023-11-08 DIAGNOSIS — Z136 Encounter for screening for cardiovascular disorders: Secondary | ICD-10-CM | POA: Diagnosis not present

## 2023-11-08 DIAGNOSIS — E7849 Other hyperlipidemia: Secondary | ICD-10-CM

## 2023-11-08 DIAGNOSIS — R931 Abnormal findings on diagnostic imaging of heart and coronary circulation: Secondary | ICD-10-CM

## 2023-11-08 DIAGNOSIS — Z789 Other specified health status: Secondary | ICD-10-CM | POA: Diagnosis not present

## 2023-11-08 NOTE — Telephone Encounter (Signed)
 FYI-Lab will be completed on 02/07

## 2023-11-08 NOTE — Telephone Encounter (Signed)
 Patient called to let Dr. Arthea Larsson know she will do the lab test tomorrow morning (2/7).

## 2023-11-08 NOTE — Progress Notes (Signed)
 Cardiology Office Note  Date: 11/08/2023   ID: Linda, Mayer 1956-04-19, MRN [id number]  PCP:  [facility]  Cardiologist:  Diannah SHAUNNA Maywood, MD Electrophysiologist:  None   Reason for Office Visit: Dizziness   History of Present Illness: Linda Mayer is a 68 y.o. female known to have R ICA stenosis 60 to 79% and L ICA stenosis 40 to 59% (follows with vascular surgery), HTN, hypothyroidism, nicotine abuse is here for follow-up visit.  Patient has dizziness from sitting to standing position.  She notes that she has to stand up slowly but she does not like to do things slow.  No syncope.  No angina, DOE, orthopnea, PND, leg swelling.  Previously used to smoke 1 pack cigarettes daily.  Currently in the process of quitting.  She has days where she does not smoke any cigarette and she also has days where she smokes couple to few cigarettes.  ABIs were within normal limits.  Currently on rosuvastatin  10 mg twice weekly but she forgets to take rosuvastatin  sometimes.  Has myalgias even though she takes twice weekly.  She tried atorvastatin in the past with myalgias as well.  She recently underwent partial lobectomy of her lung due to carcinoid tumor of the lung.  Past Medical History:  Diagnosis Date   AAA (abdominal aortic aneurysm) (HCC)    Anxiety    Carcinoid tumor of right lung    Complication of anesthesia    Pt states that her BP drops really low after anesthesia. Hard time waking up   Coronary artery disease    Depression    situational   Dizziness    GERD (gastroesophageal reflux disease)    Headache    Hypertension    Hypothyroidism    Peripheral vascular disease (HCC)     Past Surgical History:  Procedure Laterality Date   ABDOMINAL HYSTERECTOMY     BARTHOLIN CYST MARSUPIALIZATION     Removal   BRONCHIAL BIOPSY  03/20/2023   Procedure: BRONCHIAL BIOPSIES;  Surgeon: Shelah Lamar RAMAN, MD;  Location: [facility] ENDOSCOPY;  Service: Pulmonary;;    BRONCHIAL BRUSHINGS  03/20/2023   Procedure: BRONCHIAL BRUSHINGS;  Surgeon: Shelah Lamar RAMAN, MD;  Location: [facility] ENDOSCOPY;  Service: Pulmonary;;   BRONCHIAL NEEDLE ASPIRATION BIOPSY  03/20/2023   Procedure: BRONCHIAL NEEDLE ASPIRATION BIOPSIES;  Surgeon: Shelah Lamar RAMAN, MD;  Location: [facility] ENDOSCOPY;  Service: Pulmonary;;   BRONCHIAL WASHINGS  03/20/2023   Procedure: BRONCHIAL WASHINGS;  Surgeon: Shelah Lamar RAMAN, MD;  Location: [facility] ENDOSCOPY;  Service: Pulmonary;;   FIDUCIAL MARKER PLACEMENT  06/11/2023   Procedure: Zelphia Marking;  Surgeon: Shelah Lamar RAMAN, MD;  Location: [facility] ENDOSCOPY;  Service: Pulmonary;;   INTERCOSTAL NERVE BLOCK Right 06/11/2023   Procedure: INTERCOSTAL NERVE BLOCK;  Surgeon: Shyrl Linnie KIDD, MD;  Location: [facility] OR;  Service: Thoracic;  Laterality: Right;   NODE DISSECTION Right 06/11/2023   Procedure: NODE DISSECTION;  Surgeon: Shyrl Linnie KIDD, MD;  Location: [facility] OR;  Service: Thoracic;  Laterality: Right;   THYROIDECTOMY N/A 05/12/2016   Procedure: THYROIDECTOMY;  Surgeon: Oneil Budge, MD;  Location: [facility] ORS;  Service: General;  Laterality: N/A;   VESICOVAGINAL FISTULA CLOSURE W/ TAH      Current Outpatient Medications  Medication Sig Dispense Refill   ALPRAZolam  (XANAX ) 0.5 MG tablet Take 0.5 mg by mouth 3 (three) times daily.     aspirin  81 MG chewable tablet Chew 81 mg by mouth daily.     Calcium   Carb-Cholecalciferol (CALCIUM  600 + D PO) Take 1 tablet by mouth 2 (two) times daily.     cholecalciferol (VITAMIN D3) 25 MCG (1000 UNIT) tablet Take 1,000 Units by mouth daily.     levothyroxine  (SYNTHROID ) 88 MCG tablet Take 88 mcg by mouth daily.     lisinopril  (PRINIVIL ,ZESTRIL ) 10 MG tablet Take 10 mg by mouth daily.     mirtazapine  (REMERON ) 15 MG tablet Take 15 mg by mouth at bedtime.     rosuvastatin  (CRESTOR ) 10 MG tablet Take 10 mg by mouth 2 (two) times a week.     No current facility-administered medications for this visit.   Allergies:  Zetia [ezetimibe] and  Zocor [simvastatin]   Social History: The patient  reports that she quit smoking about 5 months ago. Her smoking use included cigarettes. She has a 25 pack-year smoking history. She has never used smokeless tobacco. She reports that she does not drink alcohol and does not use drugs.   Family History: The patient's family history includes Heart disease in her brother; Hypotension in her brother; Thyroid  disease in her brother.   ROS:  Please see the history of present illness. Otherwise, complete review of systems is positive for none.  All other systems are reviewed and negative.   Physical Exam: VS:  BP 128/80   Pulse 72   Ht 5' 5 (1.651 m)   Wt 153 lb (69.4 kg)   SpO2 96%   BMI 25.46 kg/m , BMI Body mass index is 25.46 kg/m.  Wt Readings from Last 3 Encounters:  11/08/23 153 lb (69.4 kg)  10/16/23 150 lb (68 kg)  08/09/23 145 lb 9.6 oz (66 kg)    General: Patient appears comfortable at rest. HEENT: Conjunctiva and lids normal, oropharynx clear with moist mucosa. Neck: Supple, no elevated JVP or carotid bruits, no thyromegaly. Lungs: Clear to auscultation, nonlabored breathing at rest. Cardiac: Regular rate and rhythm, no S3 or significant systolic murmur, no pericardial rub. Abdomen: Soft, nontender, no hepatomegaly, bowel sounds present, no guarding or rebound. Extremities: No pitting edema, distal pulses 2+. Skin: Warm and dry. Musculoskeletal: No kyphosis. Neuropsychiatric: Alert and oriented x3, affect grossly appropriate.  ECG:  An ECG dated 11/06/2022 was personally reviewed today and demonstrated:  Normal sinus rhythm  Recent Labwork: 07/23/2023: ALT 10; AST 11; BUN 22; Creatinine 0.99; Hemoglobin 12.3; Platelet Count 167; Potassium 4.2; Sodium 141  No results found for: CHOL, TRIG, HDL, CHOLHDL, VLDL, LDLCALC, LDLDIRECT   Assessment and Plan:   # Dizziness likely secondary to seated postural hypotension -Orthostatic vitals in the previous clinic  visit were positive for seated postural hypotension (BP drop from supine to sitting position but BP stable after standing) although she has symptoms of dizziness from sitting to standing position.  Instructed her to stand up slowly.  She does not have any exertional dizziness or syncope.  No indication of event monitor for now.  No murmur on physical exam, no need of echocardiogram.  # R ICA stenosis 60-79% # L ICA stenosis 40-59% # AAA 3.1 cm -Follows with vein and vascular -Continue aspirin  81 mg once daily -Currently takes rosuvastatin  10 mg twice weekly.  Sometimes she forgets to take rosuvastatin .  # Elevated coronary calcium  score of 50 -Continue cardioprotective medications as stated above, asymptomatic, no angina or DOE.  # HLD, unknown values # Statin intolerance -Currently takes rosuvastatin  10 mg twice weekly.  Sometimes she forgets to take rosuvastatin .  She has myalgias even if she takes rosuvastatin  twice  weekly.  She previously tried atorvastatin but did not tolerate due to myalgias.  Will obtain lipid panel now.  Goal LDL should be less than 70.  She is agreeable to start PCSK9 inhibitors if her LDL is not at goal.  # HTN, controlled -Continue lisinopril  10 mg once daily.  # Nicotine abuse -She used to smoke 1 pack/day but currently in the process of quitting.  She has days where she smokes no cigarettes at all and she also has days where she smokes couple to few cigarettes.   Medication Adjustments/Labs and Tests Ordered: Current medicines are reviewed at length with the patient today.  Concerns regarding medicines are outlined above.   Tests Ordered: Orders Placed This Encounter  Procedures   EKG 12-Lead    Medication Changes: No orders of the defined types were placed in this encounter.   Disposition:  Follow up  1 year  Signed Sylvan Sookdeo Arleta Maywood, MD, 11/08/2023 9:28 AM    [facility] [address] [place], [place], [place]  [address]

## 2023-11-08 NOTE — Patient Instructions (Signed)
 Medication Instructions:  Your physician recommends that you continue on your current medications as directed. Please refer to the Current Medication list given to you today.   Labwork: Lipid panel to be completed today at [facility]  Testing/Procedures: None  Follow-Up: Your physician recommends that you schedule a follow-up appointment in: 1 year. You will receive a reminder call in about 8 months reminding you to schedule your appointment. If you don't receive this call, please contact our office.   Any Other Special Instructions Will Be Listed Below (If Applicable). Thank you for choosing [facility]!     If you need a refill on your cardiac medications before your next appointment, please call your pharmacy.

## 2023-11-09 DIAGNOSIS — Z136 Encounter for screening for cardiovascular disorders: Secondary | ICD-10-CM | POA: Diagnosis not present

## 2023-11-09 DIAGNOSIS — E7849 Other hyperlipidemia: Secondary | ICD-10-CM | POA: Diagnosis not present

## 2023-11-20 ENCOUNTER — Telehealth: Payer: Self-pay

## 2023-11-20 DIAGNOSIS — E7849 Other hyperlipidemia: Secondary | ICD-10-CM

## 2023-11-20 MED ORDER — OMEGA-3-ACID ETHYL ESTERS 1 G PO CAPS: 2.0000 g | ORAL_CAPSULE | Freq: Two times a day (BID) | ORAL | 3 refills | Status: DC

## 2023-11-20 NOTE — Telephone Encounter (Signed)
The patient has been notified of the result and verbalized understanding.  All questions (if any) were answered. Carmelina Paddock, [place] 11/20/2023 10:58 AM

## 2023-11-20 NOTE — Telephone Encounter (Signed)
-----   Message from Vishnu P Mallipeddi sent at 11/16/2023  2:03 PM EST ----- TG 524, unable to calculate LDL due to elevated TG. Start Lovaza 2g BID and repeat fasting lipid panel in 3 months.

## 2023-12-11 ENCOUNTER — Ambulatory Visit: Payer: Medicare Other | Admitting: Nurse Practitioner

## 2024-01-09 DIAGNOSIS — Z0001 Encounter for general adult medical examination with abnormal findings: Secondary | ICD-10-CM | POA: Diagnosis not present

## 2024-01-09 DIAGNOSIS — R0989 Other specified symptoms and signs involving the circulatory and respiratory systems: Secondary | ICD-10-CM | POA: Diagnosis not present

## 2024-01-09 DIAGNOSIS — F5101 Primary insomnia: Secondary | ICD-10-CM | POA: Diagnosis not present

## 2024-01-09 DIAGNOSIS — I10 Essential (primary) hypertension: Secondary | ICD-10-CM | POA: Diagnosis not present

## 2024-01-09 DIAGNOSIS — E063 Autoimmune thyroiditis: Secondary | ICD-10-CM | POA: Diagnosis not present

## 2024-01-09 DIAGNOSIS — M47816 Spondylosis without myelopathy or radiculopathy, lumbar region: Secondary | ICD-10-CM | POA: Diagnosis not present

## 2024-01-09 DIAGNOSIS — I6529 Occlusion and stenosis of unspecified carotid artery: Secondary | ICD-10-CM | POA: Diagnosis not present

## 2024-01-09 DIAGNOSIS — D3A090 Benign carcinoid tumor of the bronchus and lung: Secondary | ICD-10-CM | POA: Diagnosis not present

## 2024-01-11 ENCOUNTER — Ambulatory Visit (HOSPITAL_COMMUNITY)
Admission: RE | Admit: 2024-01-11 | Discharge: 2024-01-11 | Disposition: A | Discharge status: Home / Self Care | Source: Ambulatory Visit | Attending: Internal Medicine | Admitting: Internal Medicine

## 2024-01-11 DIAGNOSIS — D3A090 Benign carcinoid tumor of the bronchus and lung: Secondary | ICD-10-CM | POA: Diagnosis not present

## 2024-01-11 DIAGNOSIS — C7A8 Other malignant neuroendocrine tumors: Secondary | ICD-10-CM | POA: Diagnosis not present

## 2024-01-11 DIAGNOSIS — R918 Other nonspecific abnormal finding of lung field: Secondary | ICD-10-CM | POA: Diagnosis not present

## 2024-01-11 DIAGNOSIS — I700 Atherosclerosis of aorta: Secondary | ICD-10-CM | POA: Diagnosis not present

## 2024-01-11 MED ORDER — IOHEXOL 300 MG/ML  SOLN
75.0000 mL | Freq: Once | INTRAMUSCULAR | Status: AC | PRN
  Administered 2024-01-11: 75 mL via INTRAVENOUS

## 2024-01-17 ENCOUNTER — Inpatient Hospital Stay: Payer: Medicare Other | Attending: Internal Medicine | Admitting: Internal Medicine

## 2024-01-17 VITALS — BP 131/61 | HR 71 | Temp 98.2°F | Resp 15 | Ht 65.0 in | Wt 156.7 lb

## 2024-01-17 DIAGNOSIS — C7A090 Malignant carcinoid tumor of the bronchus and lung: Secondary | ICD-10-CM | POA: Diagnosis not present

## 2024-01-17 NOTE — Progress Notes (Signed)
 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 Cliffton Asters.   CURRENT THERAPY: Observation.  INTERVAL HISTORY: Linda Mayer 68 y.o. female returns to the clinic today for 48-month follow-up visit.Discussed the use of AI scribe software for clinical note transcription with the patient, who gave verbal consent to proceed.  History of Present Illness   Linda Mayer is a 68 year old female with stage one A carcinoid tumors of the right upper and middle lobes who presents for evaluation with repeat CT scan for restaging of her disease. She was referred by Dr. Cliffton Asters for ongoing management and observation post-surgery.  Diagnosed with stage one A carcinoid tumors of the right upper lobe and right middle lobe, along with a few other small pulmonary nodules, in June 2024. Underwent a wedge resection of the right upper lobe and right middle lobe in September 2024. Currently in an observation.  No new complaints since the last visit six months ago. No chest pain, breathing issues, or hemoptysis. Denies nausea, vomiting, and weight loss, although there is a weight gain of three pounds since the last visit.  A recent CT scan revealed a tiny, three-millimeter nodule in the upper part of the right lung, which appears slightly more dense.       MEDICAL HISTORY: Past Medical History:  Diagnosis Date   AAA  (abdominal aortic aneurysm) (HCC)    Anxiety    Carcinoid tumor of right lung    Complication of anesthesia    Pt states that her BP drops really low after anesthesia. Hard time waking up   Coronary artery disease    Depression    situational   Dizziness    GERD (gastroesophageal reflux disease)    Headache    Hypertension    Hypothyroidism    Peripheral vascular disease (HCC)     ALLERGIES:  is allergic to zetia [ezetimibe] and zocor [simvastatin].  MEDICATIONS:  Current Outpatient Medications  Medication Sig Dispense Refill   ALPRAZolam (XANAX) 0.5 MG tablet Take 0.5 mg by mouth 3 (three) times daily.     aspirin 81 MG chewable tablet Chew 81 mg by mouth daily.     Calcium Carb-Cholecalciferol (CALCIUM 600 + D PO) Take 1 tablet by mouth 2 (two) times daily.     cholecalciferol (VITAMIN D3) 25 MCG (1000 UNIT) tablet Take 1,000 Units by mouth daily.     levothyroxine (SYNTHROID) 88 MCG tablet Take 88 mcg by mouth daily.     lisinopril (PRINIVIL,ZESTRIL) 10 MG tablet Take 10 mg by mouth daily.     mirtazapine (REMERON) 15 MG tablet Take 15 mg by mouth at bedtime.     omega-3 acid ethyl esters (LOVAZA) 1 g capsule Take 2 capsules (2 g total) by mouth 2 (two) times daily. 60 capsule 3   rosuvastatin (CRESTOR) 10 MG tablet Take 10 mg by mouth 2 (two) times a week.  No current facility-administered medications for this visit.    SURGICAL HISTORY:  Past Surgical History:  Procedure Laterality Date   ABDOMINAL HYSTERECTOMY     BARTHOLIN CYST MARSUPIALIZATION     Removal   BRONCHIAL BIOPSY  03/20/2023   Procedure: BRONCHIAL BIOPSIES;  Surgeon: Denson Flake, MD;  Location: [facility] ENDOSCOPY;  Service: Pulmonary;;   BRONCHIAL BRUSHINGS  03/20/2023   Procedure: BRONCHIAL BRUSHINGS;  Surgeon: Denson Flake, MD;  Location: [facility] ENDOSCOPY;  Service: Pulmonary;;   BRONCHIAL NEEDLE ASPIRATION BIOPSY  03/20/2023   Procedure: BRONCHIAL NEEDLE ASPIRATION BIOPSIES;  Surgeon: Denson Flake,  MD;  Location: [facility] ENDOSCOPY;  Service: Pulmonary;;   BRONCHIAL WASHINGS  03/20/2023   Procedure: BRONCHIAL WASHINGS;  Surgeon: Denson Flake, MD;  Location: [facility] ENDOSCOPY;  Service: Pulmonary;;   FIDUCIAL MARKER PLACEMENT  06/11/2023   Procedure: Malena Scull Marking;  Surgeon: Denson Flake, MD;  Location: [facility] ENDOSCOPY;  Service: Pulmonary;;   INTERCOSTAL NERVE BLOCK Right 06/11/2023   Procedure: INTERCOSTAL NERVE BLOCK;  Surgeon: Hilarie Lovely, MD;  Location: [facility] OR;  Service: Thoracic;  Laterality: Right;   NODE DISSECTION Right 06/11/2023   Procedure: NODE DISSECTION;  Surgeon: Hilarie Lovely, MD;  Location: [facility] OR;  Service: Thoracic;  Laterality: Right;   THYROIDECTOMY N/A 05/12/2016   Procedure: THYROIDECTOMY;  Surgeon: Alanda Allegra, MD;  Location: [facility] ORS;  Service: General;  Laterality: N/A;   VESICOVAGINAL FISTULA CLOSURE W/ TAH      REVIEW OF SYSTEMS:  A comprehensive review of systems was negative.   PHYSICAL EXAMINATION: General appearance: alert, cooperative, and no distress Head: Normocephalic, without obvious abnormality, atraumatic Neck: no adenopathy, no JVD, supple, symmetrical, trachea midline, and thyroid not enlarged, symmetric, no tenderness/mass/nodules Lymph nodes: Cervical, supraclavicular, and axillary nodes normal. Resp: clear to auscultation bilaterally Back: symmetric, no curvature. ROM normal. No CVA tenderness. Cardio: regular rate and rhythm, S1, S2 normal, no murmur, click, rub or gallop GI: soft, non-tender; bowel sounds normal; no masses,  no organomegaly Extremities: extremities normal, atraumatic, no cyanosis or edema  ECOG PERFORMANCE STATUS: 0 - Asymptomatic  Blood pressure 131/61, pulse 71, temperature 98.2 F (36.8 C), temperature source Temporal, resp. rate 15, height 5\' 5"  (1.651 m), weight 156 lb 11.2 oz (71.1 kg), SpO2 97%.  LABORATORY DATA: Lab Results  Component Value Date   WBC 6.2 07/23/2023   HGB 12.3 07/23/2023   HCT 36.9 07/23/2023    MCV 93.2 07/23/2023   PLT 167 07/23/2023      Chemistry      Component Value Date/Time   NA 141 07/23/2023 1338   K 4.2 07/23/2023 1338   CL 106 07/23/2023 1338   CO2 28 07/23/2023 1338   BUN 22 07/23/2023 1338   CREATININE 0.99 07/23/2023 1338      Component Value Date/Time   CALCIUM 9.6 07/23/2023 1338   ALKPHOS 62 07/23/2023 1338   AST 11 (L) 07/23/2023 1338   ALT 10 07/23/2023 1338   BILITOT 0.4 07/23/2023 1338       RADIOGRAPHIC STUDIES: CT Chest W Contrast Result Date: 01/17/2024 CLINICAL DATA:  Neuroendocrine tumor diagnosed 11 months ago. Previous right upper and middle lobe wedge resection for metastatic disease 06/11/2023. * Tracking Code: BO * EXAM: CT CHEST WITH CONTRAST TECHNIQUE: Multidetector CT imaging of the chest was performed during intravenous contrast administration. RADIATION DOSE REDUCTION: This exam was performed according to the departmental dose-optimization program which includes automated exposure control, adjustment of the mA and/or kV according to  patient size and/or use of iterative reconstruction technique. CONTRAST:  75mL OMNIPAQUE IOHEXOL 300 MG/ML  SOLN COMPARISON:  Chest radiographs 08/09/2023 and 07/06/2023. Chest CT 02/21/2023 and PET-CT 05/30/2023. FINDINGS: Cardiovascular: Atherosclerosis of the aorta, great vessels and coronary arteries. No acute vascular findings are demonstrated. The heart size is normal. There is no pericardial effusion. Mediastinum/Nodes: There are no enlarged mediastinal, hilar or axillary lymph nodes.Probable previous thyroidectomy. The esophagus and trachea appear unremarkable. Lungs/Pleura: There is no pleural effusion. There are postsurgical changes from interval wedge resection in the right upper and middle lobes. Streaky opacity adjacent to the chain sutures likely represents atelectasis or scarring. Right lung evaluation limited by the resulting postsurgical distortion. At the right apex, there is a 3 mm nodule on  image 27/6 which appears slightly enlarged. 4 mm right middle lobe nodule on image 82/6 is grossly unchanged based on the sagittal images. Likewise, a 5 mm right lower lobe nodule on image 103/6 is grossly unchanged. No new or enlarging nodules are identified in the left lung. There is stable mild biapical scarring. Upper abdomen: The visualized upper abdomen appears stable, without significant findings. There is a stable cyst centrally in the liver which is unchanged. No adrenal mass. Musculoskeletal/Chest wall: There is no chest wall mass or suspicious osseous finding. IMPRESSION: 1. Interval wedge resection in the right upper and middle lobes. 2. There is a 3 mm nodule at the right apex which appears slightly enlarged, indeterminate. Additional small right lung nodules are grossly unchanged. Recommend attention on follow-up chest CT. 3. No adenopathy or other evidence of metastatic disease. 4. Aortic atherosclerosis. Electronically Signed   By: Carey Bullocks M.D.   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 Cliffton Asters.  She is currently on observation and feeling fine. She had repeat CT scan of the chest performed recently.  I personally and independently reviewed the scan and discussed the result with the patient today.    Stage IA Carcinoid Tumor of the Lung Linda Mayer has stage IA carcinoid tumors in the right upper and middle lobes, diagnosed in June 2024. She underwent wedge resection of the right upper and middle  lobes in September 2024. Currently, she is in an observation. A recent CT scan shows a 3 mm nodule in the upper right lung, appearing slightly more dense. The nodule is not concerning at this time, given the slow-growing nature of carcinoid tumors. She reports no new symptoms such as chest pain, dyspnea, or hemoptysis. The decision to continue observation is based on the slow growth rate of carcinoid tumors, with the expectation of no sudden increase in size or symptoms. - Schedule follow-up CT scan in one year - Advise her to report any new symptoms or changes   The patient was advised to call immediately if she has any other concerning symptoms in the interval. The patient voices understanding of current disease status and treatment options and is in agreement with the current care plan.  All questions were answered. The patient knows to call the clinic  with any problems, questions or concerns. We can certainly see the patient much sooner if necessary.  The total time spent in the appointment was 20 minutes.  Disclaimer: This note was dictated with voice recognition software. Similar sounding words can inadvertently be transcribed and may not be corrected upon review.

## 2024-01-18 ENCOUNTER — Telehealth: Payer: Self-pay | Admitting: Internal Medicine

## 2024-01-18 NOTE — Telephone Encounter (Signed)
 Left a detailed message of appointment details. Informed patient to call back if appointments do not work.

## 2024-02-08 DIAGNOSIS — E063 Autoimmune thyroiditis: Secondary | ICD-10-CM | POA: Diagnosis not present

## 2024-02-08 DIAGNOSIS — E559 Vitamin D deficiency, unspecified: Secondary | ICD-10-CM | POA: Diagnosis not present

## 2024-02-08 DIAGNOSIS — D3A090 Benign carcinoid tumor of the bronchus and lung: Secondary | ICD-10-CM | POA: Diagnosis not present

## 2024-02-08 DIAGNOSIS — I10 Essential (primary) hypertension: Secondary | ICD-10-CM | POA: Diagnosis not present

## 2024-02-08 DIAGNOSIS — Z0001 Encounter for general adult medical examination with abnormal findings: Secondary | ICD-10-CM | POA: Diagnosis not present

## 2024-02-09 DIAGNOSIS — R1111 Vomiting without nausea: Secondary | ICD-10-CM | POA: Diagnosis not present

## 2024-02-09 DIAGNOSIS — R110 Nausea: Secondary | ICD-10-CM | POA: Diagnosis not present

## 2024-02-09 DIAGNOSIS — F17210 Nicotine dependence, cigarettes, uncomplicated: Secondary | ICD-10-CM | POA: Diagnosis not present

## 2024-02-09 DIAGNOSIS — R197 Diarrhea, unspecified: Secondary | ICD-10-CM | POA: Diagnosis not present

## 2024-02-09 DIAGNOSIS — R42 Dizziness and giddiness: Secondary | ICD-10-CM | POA: Diagnosis not present

## 2024-02-09 DIAGNOSIS — I10 Essential (primary) hypertension: Secondary | ICD-10-CM | POA: Diagnosis not present

## 2024-02-09 DIAGNOSIS — K529 Noninfective gastroenteritis and colitis, unspecified: Secondary | ICD-10-CM | POA: Diagnosis not present

## 2024-02-28 DIAGNOSIS — R42 Dizziness and giddiness: Secondary | ICD-10-CM | POA: Diagnosis not present

## 2024-02-28 DIAGNOSIS — I6529 Occlusion and stenosis of unspecified carotid artery: Secondary | ICD-10-CM | POA: Diagnosis not present

## 2024-02-28 DIAGNOSIS — I10 Essential (primary) hypertension: Secondary | ICD-10-CM | POA: Diagnosis not present

## 2024-02-28 DIAGNOSIS — R0989 Other specified symptoms and signs involving the circulatory and respiratory systems: Secondary | ICD-10-CM | POA: Diagnosis not present

## 2024-03-25 DIAGNOSIS — I6529 Occlusion and stenosis of unspecified carotid artery: Secondary | ICD-10-CM | POA: Diagnosis not present

## 2024-03-25 DIAGNOSIS — D3A090 Benign carcinoid tumor of the bronchus and lung: Secondary | ICD-10-CM | POA: Diagnosis not present

## 2024-03-25 DIAGNOSIS — G720 Drug-induced myopathy: Secondary | ICD-10-CM | POA: Diagnosis not present

## 2024-03-25 DIAGNOSIS — I10 Essential (primary) hypertension: Secondary | ICD-10-CM | POA: Diagnosis not present

## 2024-04-05 ENCOUNTER — Other Ambulatory Visit: Payer: Self-pay | Admitting: Internal Medicine

## 2024-04-21 NOTE — Progress Notes (Unsigned)
 Refer to [facility] for statin intolerance  [facility] [place]  ASSESSMENT / PLAN: Linda Mayer is a 68 y.o. female with: # infrarenal abdominal aortic aneurysm measuring 31mm.  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  Recommend:  Abstinence from all tobacco products. Blood glucose control with goal A1c < 7%. Blood pressure control with goal blood pressure < 140/90 mmHg. Lipid reduction therapy with goal LDL-C <100 mg/dL.  Aspirin  81mg  PO QD.  Atorvastatin 40-80mg  PO QD (or other high intensity statin therapy).  Continue surveillance of carotid stenosis with duplex in 6 months. Recheck AAA with duplex in 3 years (2027).   CHIEF COMPLAINT: follow up carotid stenosis  HISTORY OF PRESENT ILLNESS: Linda Mayer is a 69 y.o. female who has previously seen my partner, Dr. Oris, for carotid artery stenosis. This was asymptomatic when first discovered. The patient's primary doctor identified a bruit, and went on to discover the stenosis. The patient continues to have no symptoms from a neurologic perspective.  She denies amaurosis, facial weakness, difficulty speaking, unilateral weakness.  She is working hard on quitting smoking.  She is compliant with best medical therapy for atherosclerotic disease.  10/16/23: Doing well overall. No stroke or TIA symptoms. Reviewed duplex.   Past Medical History:  Diagnosis Date   AAA (abdominal aortic aneurysm) (HCC)    Anxiety    Carcinoid tumor of right lung    Complication of anesthesia    Pt states that her BP drops really low after anesthesia. Hard time waking up   Coronary artery disease    Depression    situational   Dizziness    GERD (gastroesophageal reflux disease)    Headache    Hypertension    Hypothyroidism    Peripheral vascular disease (HCC)     Past Surgical History:  Procedure Laterality Date   ABDOMINAL HYSTERECTOMY     BARTHOLIN CYST MARSUPIALIZATION     Removal   BRONCHIAL BIOPSY   03/20/2023   Procedure: BRONCHIAL BIOPSIES;  Surgeon: Shelah Lamar RAMAN, MD;  Location: [facility] ENDOSCOPY;  Service: Pulmonary;;   BRONCHIAL BRUSHINGS  03/20/2023   Procedure: BRONCHIAL BRUSHINGS;  Surgeon: Shelah Lamar RAMAN, MD;  Location: [facility] ENDOSCOPY;  Service: Pulmonary;;   BRONCHIAL NEEDLE ASPIRATION BIOPSY  03/20/2023   Procedure: BRONCHIAL NEEDLE ASPIRATION BIOPSIES;  Surgeon: Shelah Lamar RAMAN, MD;  Location: [facility] ENDOSCOPY;  Service: Pulmonary;;   BRONCHIAL WASHINGS  03/20/2023   Procedure: BRONCHIAL WASHINGS;  Surgeon: Shelah Lamar RAMAN, MD;  Location: [facility] ENDOSCOPY;  Service: Pulmonary;;   FIDUCIAL MARKER PLACEMENT  06/11/2023   Procedure: Zelphia Marking;  Surgeon: Shelah Lamar RAMAN, MD;  Location: [facility] ENDOSCOPY;  Service: Pulmonary;;   INTERCOSTAL NERVE BLOCK Right 06/11/2023   Procedure: INTERCOSTAL NERVE BLOCK;  Surgeon: Shyrl Linnie KIDD, MD;  Location: [facility] OR;  Service: Thoracic;  Laterality: Right;   NODE DISSECTION Right 06/11/2023   Procedure: NODE DISSECTION;  Surgeon: Shyrl Linnie KIDD, MD;  Location: [facility] OR;  Service: Thoracic;  Laterality: Right;   THYROIDECTOMY N/A 05/12/2016   Procedure: THYROIDECTOMY;  Surgeon: Oneil Budge, MD;  Location: [facility] ORS;  Service: General;  Laterality: N/A;   VESICOVAGINAL FISTULA CLOSURE W/ TAH      Family History  Problem Relation Age of Onset   Heart disease Brother    Thyroid  disease Brother    Hypotension Brother     Social History   Socioeconomic History   Marital status: Married    Spouse name: Not on file  Number of children: 2   Years of education: Not on file   Highest education level: Not on file  Occupational History   Not on file  Tobacco Use   Smoking status: Former    Current packs/day: 0.00    Average packs/day: 0.5 packs/day for 50.0 years (25.0 ttl pk-yrs)    Types: Cigarettes    Quit date: 05/24/2023    Years since quitting: 0.9   Smokeless tobacco: Never  Vaping Use   Vaping status: Never Used  Substance and Sexual Activity    Alcohol use: No   Drug use: No   Sexual activity: Not on file  Other Topics Concern   Not on file  Social History Narrative   Not on file   Social Drivers of Health   Financial Resource Strain: Not on file  Food Insecurity: No Food Insecurity (06/11/2023)   Hunger Vital Sign    Worried About Running Out of Food in the Last Year: Never true    Ran Out of Food in the Last Year: Never true  Transportation Needs: No Transportation Needs (06/11/2023)   PRAPARE - Administrator, Civil Service (Medical): No    Lack of Transportation (Non-Medical): No  Physical Activity: Not on file  Stress: Not on file  Social Connections: Not on file  Intimate Partner Violence: Not At Risk (06/11/2023)   Humiliation, Afraid, Rape, and Kick questionnaire    Fear of Current or Ex-Partner: No    Emotionally Abused: No    Physically Abused: No    Sexually Abused: No    Allergies  Allergen Reactions   Zetia [Ezetimibe] Diarrhea   Zocor [Simvastatin]     Muscle pain     Current Outpatient Medications  Medication Sig Dispense Refill   ALPRAZolam  (XANAX ) 0.5 MG tablet Take 0.5 mg by mouth 3 (three) times daily.     aspirin  81 MG chewable tablet Chew 81 mg by mouth daily.     Calcium  Carb-Cholecalciferol (CALCIUM  600 + D PO) Take 1 tablet by mouth 2 (two) times daily.     cholecalciferol (VITAMIN D3) 25 MCG (1000 UNIT) tablet Take 1,000 Units by mouth daily.     levothyroxine  (SYNTHROID ) 88 MCG tablet Take 88 mcg by mouth daily.     lisinopril  (PRINIVIL ,ZESTRIL ) 10 MG tablet Take 10 mg by mouth daily.     mirtazapine  (REMERON ) 15 MG tablet Take 15 mg by mouth at bedtime.     omega-3 acid ethyl esters (LOVAZA ) 1 g capsule TAKE TWO CAPSULES BY MOUTH TWICE DAILY 360 capsule 1   rosuvastatin  (CRESTOR ) 10 MG tablet Take 10 mg by mouth 2 (two) times a week.     No current facility-administered medications for this visit.    PHYSICAL EXAM There were no vitals filed for this visit.   Middle-age  woman in no acute distress Regular rate and rhythm Unlabored breathing No focal neurologic deficits   PERTINENT LABORATORY AND RADIOLOGIC DATA  Most recent CBC    Latest Ref Rng & Units 07/23/2023    1:38 PM 06/12/2023    2:18 AM 06/07/2023   11:30 AM  CBC  WBC 4.0 - 10.5 K/uL 6.2  11.9  7.1   Hemoglobin 12.0 - 15.0 g/dL 87.6  88.0  86.2   Hematocrit 36.0 - 46.0 % 36.9  35.1  40.8   Platelets 150 - 400 K/uL 167  154  159      Most recent CMP    Latest Ref Rng &  Units 07/23/2023    1:38 PM 06/12/2023    2:18 AM 06/11/2023    8:45 AM  CMP  Glucose 70 - 99 mg/dL 864  883  895   BUN 8 - 23 mg/dL 22  17  16    Creatinine 0.44 - 1.00 mg/dL 9.00  9.09  9.17   Sodium 135 - 145 mmol/L 141  137  138   Potassium 3.5 - 5.1 mmol/L 4.2  4.5  4.2   Chloride 98 - 111 mmol/L 106  106  110   CO2 22 - 32 mmol/L 28  23  22    Calcium  8.9 - 10.3 mg/dL 9.6  8.6  7.9   Total Protein 6.5 - 8.1 g/dL 7.1   5.4   Total Bilirubin 0.3 - 1.2 mg/dL 0.4   0.7   Alkaline Phos 38 - 126 U/L 62   52   AST 15 - 41 U/L 11   16   ALT 0 - 44 U/L 10   12    Carotid Duplex 04/22/24  Right Carotid: Velocities in the right ICA are consistent with a 60-79%                 stenosis.   Left Carotid: Velocities in the left ICA are consistent with a 60-79%  stenosis.   Vertebrals: Bilateral vertebral arteries demonstrate antegrade flow.  Elevated              velocities in bilateral vertebral arteries left > right.  Subclavians: Right subclavian artery flow was disturbed. Left subclavian  artery              flow is monophasic.    Debby SAILOR. Magda, MD [facility] Phone Number: [phone] 04/21/2024 4:59 PM   Total time spent on preparing this encounter including chart review, data review, collecting history, examining the patient, coordinating care for this established patient, 30 minutes.  Portions of this report may have been transcribed using voice  recognition software.  Every effort has been made to ensure accuracy; however, inadvertent computerized transcription errors may still be present.

## 2024-04-22 ENCOUNTER — Encounter

## 2024-04-22 ENCOUNTER — Ambulatory Visit: Admitting: Vascular Surgery

## 2024-04-22 ENCOUNTER — Encounter: Payer: Self-pay | Admitting: Vascular Surgery

## 2024-04-22 ENCOUNTER — Ambulatory Visit: Payer: Medicare Other | Admitting: Vascular Surgery

## 2024-04-22 VITALS — BP 136/73 | HR 60 | Ht 65.0 in | Wt 156.0 lb

## 2024-04-22 DIAGNOSIS — I6523 Occlusion and stenosis of bilateral carotid arteries: Secondary | ICD-10-CM

## 2024-05-13 DIAGNOSIS — M722 Plantar fascial fibromatosis: Secondary | ICD-10-CM | POA: Diagnosis not present

## 2024-07-21 ENCOUNTER — Telehealth: Payer: Self-pay | Admitting: *Deleted

## 2024-07-21 ENCOUNTER — Other Ambulatory Visit: Payer: Self-pay | Admitting: *Deleted

## 2024-07-21 NOTE — Telephone Encounter (Signed)
 Spoke with pt. she was calling about lab work. Instructed pt. appts for lab 01/05/25 and MD appt. 01/16/25 and she will need a CT scan prior to MD appt. Scheduling should call to schedule it or she could contact Radiology to schedule.

## 2024-07-21 NOTE — Telephone Encounter (Signed)
 No orders placed.

## 2024-10-22 ENCOUNTER — Other Ambulatory Visit: Payer: Self-pay | Admitting: Vascular Surgery

## 2024-10-22 DIAGNOSIS — I6523 Occlusion and stenosis of bilateral carotid arteries: Secondary | ICD-10-CM

## 2024-11-11 ENCOUNTER — Ambulatory Visit (HOSPITAL_COMMUNITY)

## 2024-11-11 ENCOUNTER — Ambulatory Visit (HOSPITAL_COMMUNITY): Admitting: Vascular Surgery

## 2024-12-25 ENCOUNTER — Ambulatory Visit: Admitting: Internal Medicine

## 2025-01-05 ENCOUNTER — Inpatient Hospital Stay

## 2025-01-14 ENCOUNTER — Inpatient Hospital Stay: Admitting: Internal Medicine
